# Patient Record
Sex: Male | Born: 1935 | Race: White | Hispanic: No | Marital: Single | Smoking: Former smoker
Health system: Southern US, Community
[De-identification: ages and names within clinical notes are randomized; demographics above are authoritative.]

## PROBLEM LIST (undated history)

## (undated) DIAGNOSIS — I10 Essential (primary) hypertension: Secondary | ICD-10-CM

## (undated) DIAGNOSIS — C61 Malignant neoplasm of prostate: Secondary | ICD-10-CM

## (undated) DIAGNOSIS — Z950 Presence of cardiac pacemaker: Secondary | ICD-10-CM

## (undated) DIAGNOSIS — I2510 Atherosclerotic heart disease of native coronary artery without angina pectoris: Secondary | ICD-10-CM

## (undated) DIAGNOSIS — I442 Atrioventricular block, complete: Secondary | ICD-10-CM

## (undated) HISTORY — DX: Presence of cardiac pacemaker: Z95.0

## (undated) HISTORY — PX: OTHER SURGICAL HISTORY: SHX169

## (undated) HISTORY — PX: UMBILICAL HERNIA REPAIR: SHX196

## (undated) HISTORY — DX: Malignant neoplasm of prostate: C61

## (undated) HISTORY — PX: CORONARY ARTERY BYPASS GRAFT: SHX141

## (undated) HISTORY — DX: Essential (primary) hypertension: I10

## (undated) HISTORY — DX: Atrioventricular block, complete: I44.2

## (undated) HISTORY — PX: PERCUTANEOUS PINNING TIBIA FRACTURE: SUR1020

## (undated) HISTORY — DX: Atherosclerotic heart disease of native coronary artery without angina pectoris: I25.10

## (undated) HISTORY — PX: CARDIAC ASSIST DEVICE INSERTION: SHX1288

---

## 2002-03-28 ENCOUNTER — Encounter: Admission: RE | Admit: 2002-03-28 | Discharge: 2002-06-26 | Payer: Self-pay | Admitting: Neurological Surgery

## 2002-08-26 ENCOUNTER — Encounter: Admission: RE | Admit: 2002-08-26 | Discharge: 2002-08-26 | Payer: Self-pay | Admitting: Urology

## 2002-08-26 ENCOUNTER — Encounter: Payer: Self-pay | Admitting: Urology

## 2004-01-11 IMAGING — CT CT ABDOMEN W/ CM
1 series · 15 of 32 positions shown, 19 images · IV contrast (GASTRO. & OMNIAPQUE [ID])
Comparison: none

FINDINGS
CLINICAL DATA: PROSTATE CANCER.   CON:
CT OF THE ABDOMEN WITH CONTRAST
MULTIDETECTOR HELICAL SCANS THROUGH THE ABDOMEN WERE PERFORMED AFTER ORAL AND IV CONTRAST MEDIA
WERE GIVEN.  150 CC OF OMNIPAQUE 300 WERE GIVEN AS A CONTRAST MEDIA.
THE LUNG BASES SHOW MILD BULLOUS CHANGE.  NO ACTIVE PROCESS IS SEEN.  THE LIVER IS WELL SEEN WITH
NO FOCAL ABNORMALITY.  THERE IS A ROUNDED AREA OF FAT WITHIN THE RIGHT ADRENAL GLAND WHICH APPEARS
BENIGN AND MAY REPRESENT A LIPOMA.  THE LEFT ADRENAL GLAND IS NORMAL.  THE PANCREAS IS NORMAL IN
SIZE.  SCANNING THROUGH THE KIDNEYS NO HYDRONEPHROSIS IS SEEN AND NO RENAL CALCULI ARE NOTED.
THERE IS A PROBABLE SMALL CYST WITHIN THE PARENCHYMA OF THE LEFT POSTERIOR KIDNEY BUT TOO SMALL TO
CHARACTERIZE.  NO ADENOPATHY IS SEEN.  THE ABDOMINAL AORTA IS NORMAL IN CALIBER.

[Series 2: — · axial · 0.86mm/px · z∈[-364,-19]mm · 15 of 108 slices shown, 19 images]
[im 7/108  soft-tissue]
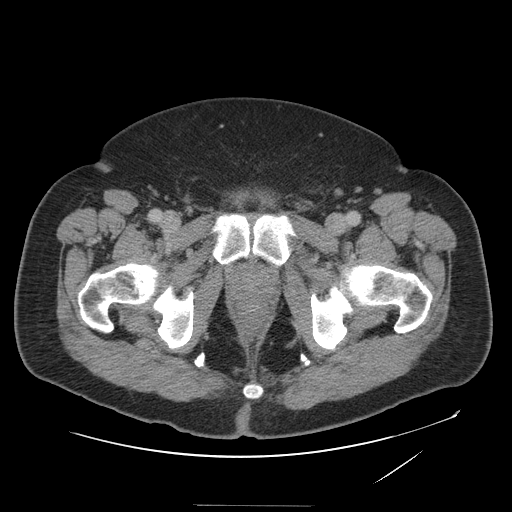
[im 7/108  bone]
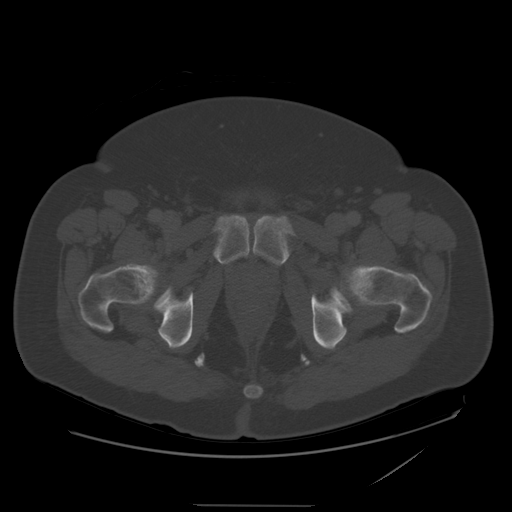
[im 14/108  soft-tissue]
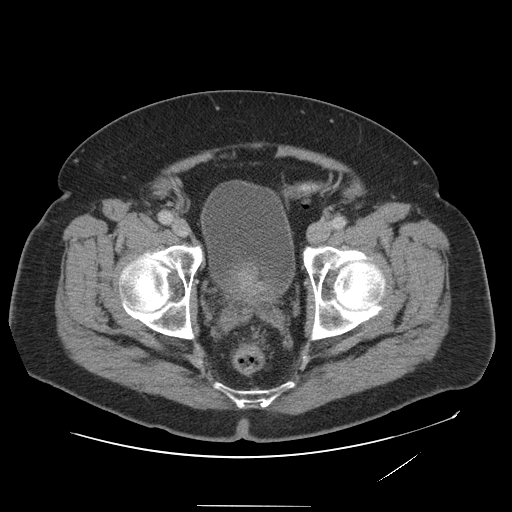
[im 21/108  soft-tissue]
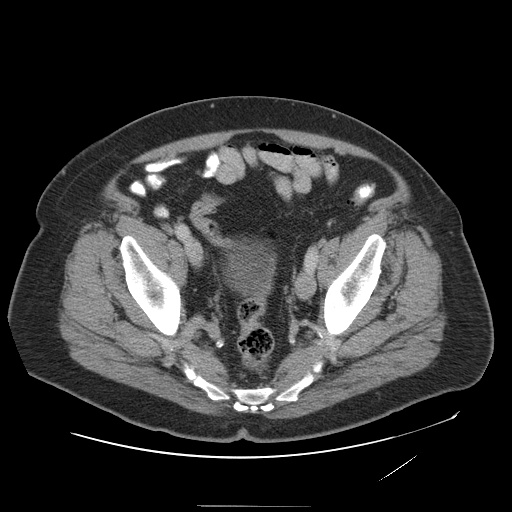
[im 32/108  soft-tissue]
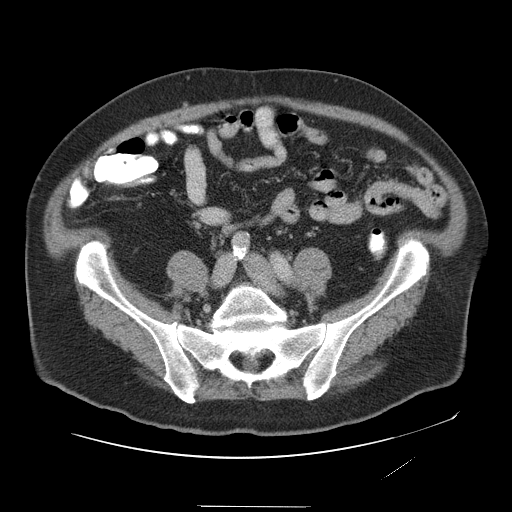
[im 38/108  soft-tissue]
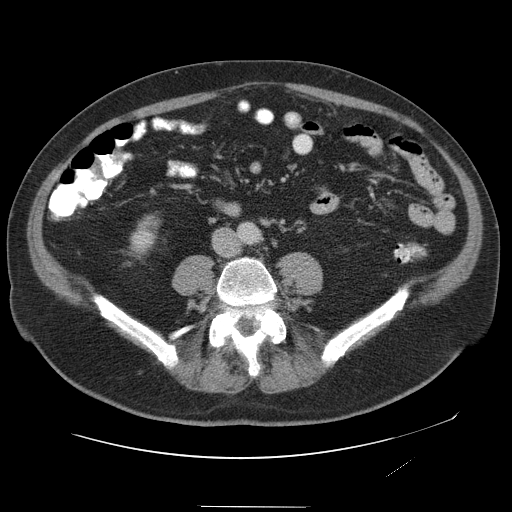
[im 45/108  soft-tissue]
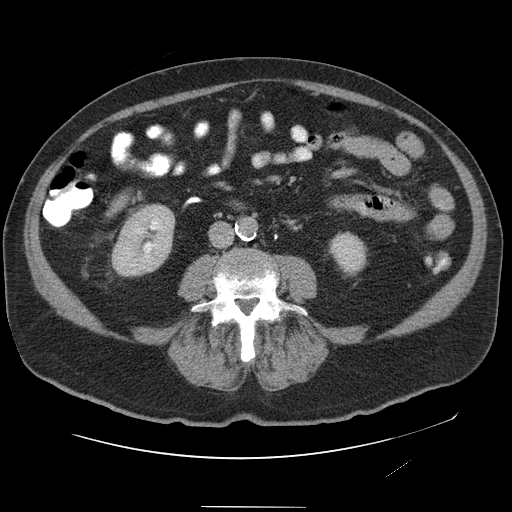
[im 56/108  soft-tissue]
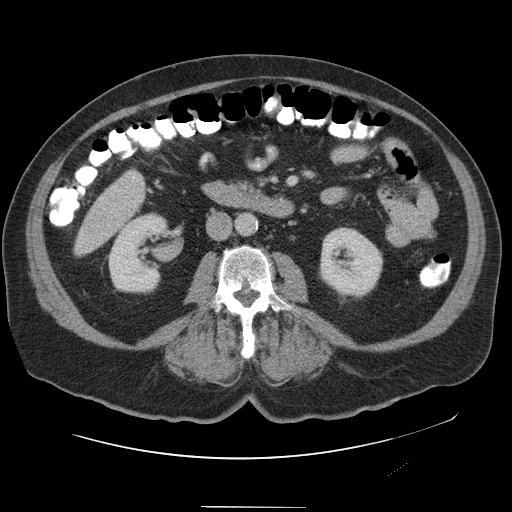
[im 63/108  soft-tissue]
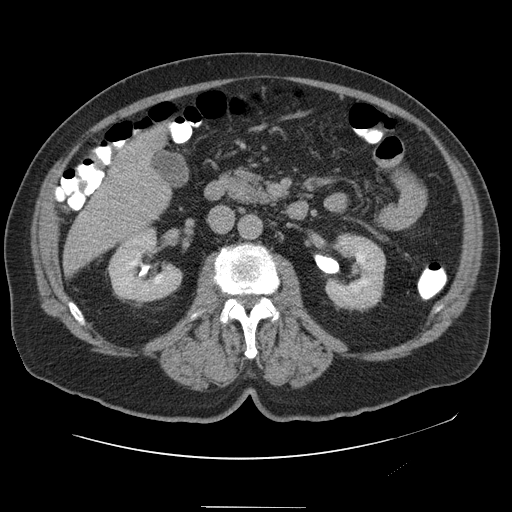
[im 70/108  soft-tissue]
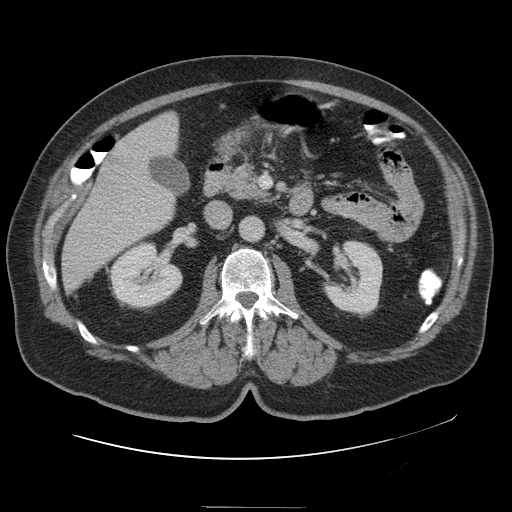
[im 70/108  bone]
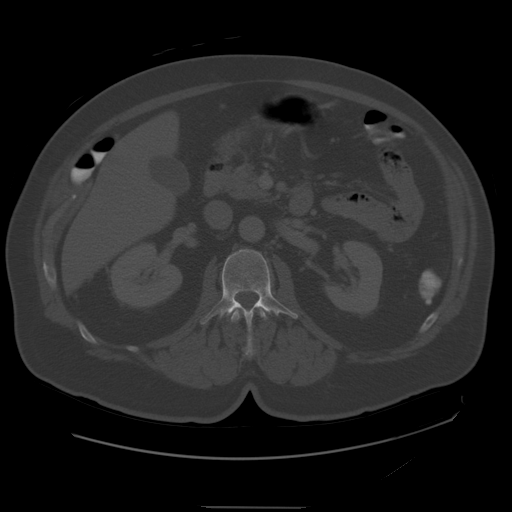
[im 76/108  soft-tissue]
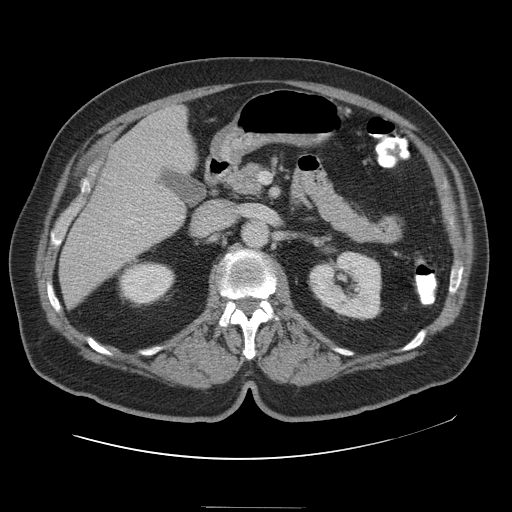
[im 87/108  soft-tissue]
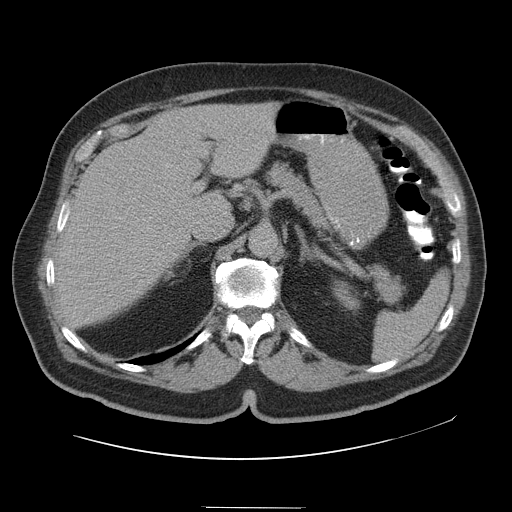
[im 94/108  soft-tissue]
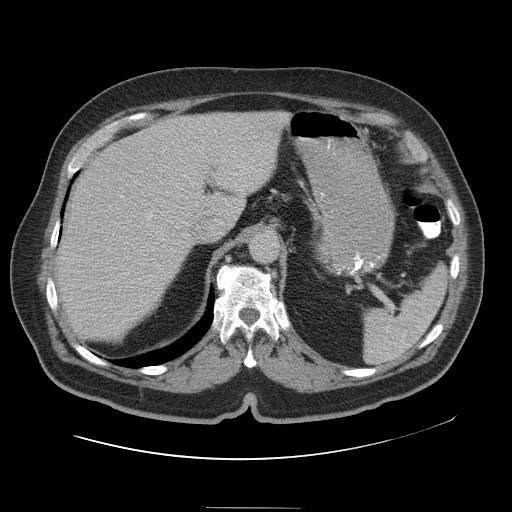
[im 94/108  lung]
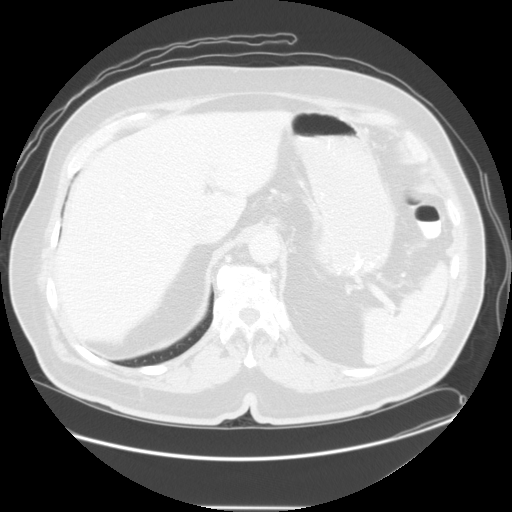
[im 97/108  lung]
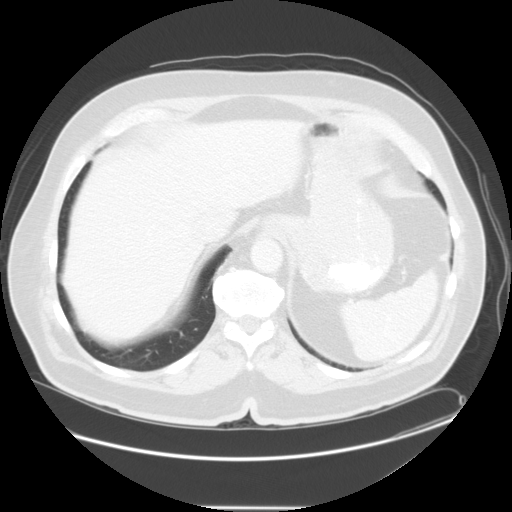
[im 101/108  soft-tissue]
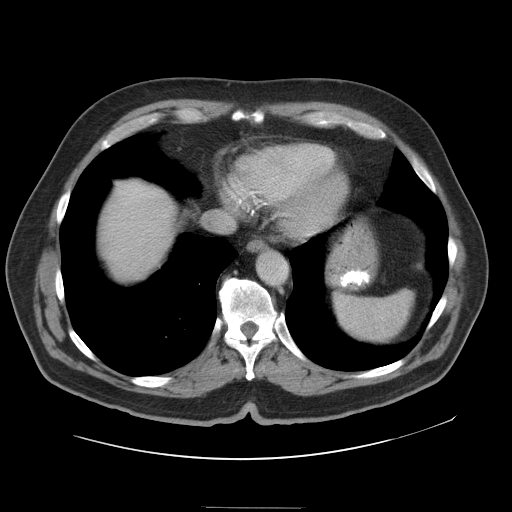
[im 101/108  lung]
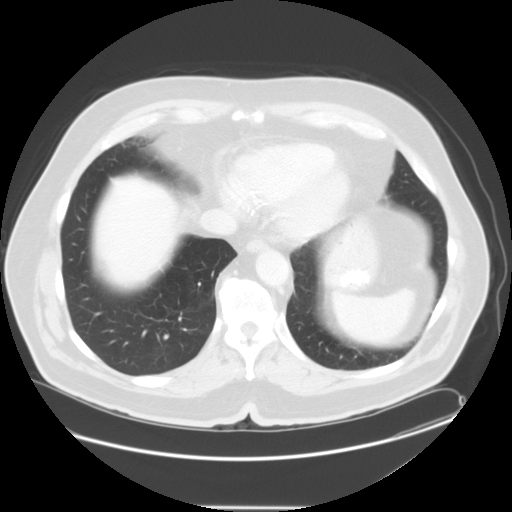
[im 104/108  lung]
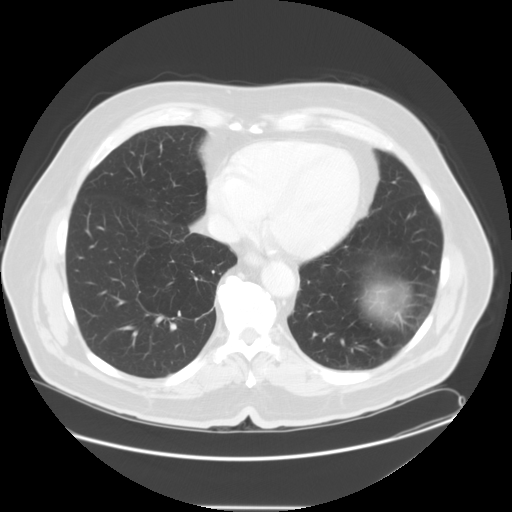

[15 of 32 positions shown; findings below may reference images not displayed]

IMPRESSION: NEGATIVE CT OF THE ABDOMEN FOR METASTATIC INVOLVEMENT.
CT OF THE PELVIS
SCANS WERE CONTINUED THROUGH THE PELVIS AFTER ORAL AND IV CONTRAST MEDIA WERE GIVEN.  THE URINARY
BLADDER IS UNREMARKABLE.  THE PROSTATE IS MODERATELY ENLARGED INDENTING THE POSTERIOR INFERIOR
ASPECT OF THE URINARY BLADDER.  THE DISTAL URETERS ARE NORMAL IN CALIBER.  THERE IS SOME THICKENING
OF THE MUCOSA OF THE RECTOSIGMOID COLON WITH A FEW DIVERTICULA BUT NO DIVERTICULITIS IS SEEN.  THE
APPENDIX IS GROSSLY NORMAL.
IMPRESSION
MODERATELY ENLARGED PROSTATE.  NO EVIDENCE OF LOCAL SPREAD OF TUMOR AND NO PELVIC ADENOPATHY.

## 2004-02-18 ENCOUNTER — Inpatient Hospital Stay (HOSPITAL_COMMUNITY): Admission: EM | Admit: 2004-02-18 | Discharge: 2004-02-23 | Payer: Self-pay | Admitting: Emergency Medicine

## 2004-02-19 ENCOUNTER — Encounter (INDEPENDENT_AMBULATORY_CARE_PROVIDER_SITE_OTHER): Payer: Self-pay | Admitting: *Deleted

## 2004-04-25 ENCOUNTER — Encounter: Admission: RE | Admit: 2004-04-25 | Discharge: 2004-07-24 | Payer: Self-pay | Admitting: Cardiology

## 2004-07-25 ENCOUNTER — Encounter (HOSPITAL_COMMUNITY): Admission: RE | Admit: 2004-07-25 | Discharge: 2004-10-23 | Payer: Self-pay | Admitting: Cardiology

## 2005-07-05 ENCOUNTER — Encounter: Admission: RE | Admit: 2005-07-05 | Discharge: 2005-08-07 | Payer: Self-pay | Admitting: Internal Medicine

## 2006-02-07 ENCOUNTER — Encounter: Admission: RE | Admit: 2006-02-07 | Discharge: 2006-02-07 | Payer: Self-pay | Admitting: Internal Medicine

## 2009-09-10 ENCOUNTER — Encounter: Payer: Self-pay | Admitting: Internal Medicine

## 2010-05-24 ENCOUNTER — Ambulatory Visit: Payer: Self-pay | Admitting: Internal Medicine

## 2010-05-24 DIAGNOSIS — Z950 Presence of cardiac pacemaker: Secondary | ICD-10-CM | POA: Insufficient documentation

## 2010-05-24 DIAGNOSIS — I442 Atrioventricular block, complete: Secondary | ICD-10-CM | POA: Insufficient documentation

## 2010-05-24 DIAGNOSIS — E669 Obesity, unspecified: Secondary | ICD-10-CM

## 2010-05-26 ENCOUNTER — Encounter: Payer: Self-pay | Admitting: Internal Medicine

## 2010-05-31 ENCOUNTER — Telehealth (INDEPENDENT_AMBULATORY_CARE_PROVIDER_SITE_OTHER): Payer: Self-pay | Admitting: *Deleted

## 2010-06-27 ENCOUNTER — Telehealth: Payer: Self-pay | Admitting: Internal Medicine

## 2010-07-15 ENCOUNTER — Encounter: Admission: RE | Admit: 2010-07-15 | Discharge: 2010-07-15 | Payer: Self-pay | Admitting: Internal Medicine

## 2010-08-25 ENCOUNTER — Ambulatory Visit: Payer: Self-pay | Admitting: Internal Medicine

## 2010-09-13 ENCOUNTER — Encounter: Payer: Self-pay | Admitting: Internal Medicine

## 2010-11-01 ENCOUNTER — Encounter: Payer: Self-pay | Admitting: Internal Medicine

## 2010-11-01 ENCOUNTER — Ambulatory Visit: Payer: Self-pay | Admitting: Internal Medicine

## 2010-11-01 DIAGNOSIS — I5032 Chronic diastolic (congestive) heart failure: Secondary | ICD-10-CM | POA: Insufficient documentation

## 2010-11-27 ENCOUNTER — Encounter: Payer: Self-pay | Admitting: Cardiology

## 2010-12-06 NOTE — Cardiovascular Report (Signed)
Summary: Office Visit   Office Visit   Imported By: Roderic Ovens 05/24/2010 15:36:00  _____________________________________________________________________  External Attachment:    Type:   Image     Comment:   External Document

## 2010-12-06 NOTE — Letter (Signed)
Summary: [facility]   Imported By: Marylou Mccoy 06/03/2010 15:56:25  _____________________________________________________________________  External Attachment:    Type:   Image     Comment:   External Document

## 2010-12-06 NOTE — Miscellaneous (Signed)
Summary: [facility]iology Device [facility]   Allergies: No Known Drug Allergies  PPM Specifications Following MD:  Sherryl Manges, MD     PPM Vendor:  Medtronic     PPM Model Number:  [id number]     PPM Serial Number:  [id number] PPM DOI:  02/22/2004     PPM Implanting MD:  Sherryl Manges, MD  Lead 1    Location: RA     DOI: 02/22/2004     Model #: [id number]     Serial #: [id number]     Status: active Lead 2    Location: RV     DOI: 02/22/2004     Model #: [id number]     Serial #: [id number]     Status: active  Parameters Mode:  DDDR     Lower Rate Limit:  60     Upper Rate Limit:  130 Paced AV Delay:  150     Sensed AV Delay:  120

## 2010-12-06 NOTE — Letter (Signed)
Summary: Remote Device Check  [facility], Main Office  [address]   [place], [place] [address]   Phone: [phone]  Fax: [phone]     September 13, 2010 MRN: [id number]   Richard Charles [address] [place], [place]  [address]   Dear Mr. STRAUCH,   Your remote transmission was recieved and reviewed by your physician.  All diagnostics were within normal limits for you.   __X____Your next office visit is scheduled for:  11-01-2010 @ 915 with Dr Graciela Husbands.   Sincerely,  Vella Kohler

## 2010-12-06 NOTE — Progress Notes (Signed)
Summary: question re new device  Phone Note Call from Patient   Caller: Patient Reason for Call: Talk to Nurse Summary of Call: questions re pacemaker-just recieved yesterday-no magnet on this one, wants to know if it's supposed to or not?-pls cll [phone] Initial call taken by: Glynda Jaeger,  May 31, 2010 9:55 AM  Follow-up for Phone Call        Reviewed procedure for remote transmissions, patient seemed to understand and will send on 08/25/10 Follow-up by: Altha Harm, LPN,  May 31, 2010 5:04 PM

## 2010-12-06 NOTE — Progress Notes (Signed)
Summary: number to medtronic  Phone Note Call from Patient   Caller: Patient Reason for Call: Talk to Nurse Summary of Call: pt needs number for medtronic-says the number on transmitter says to call if needs to be returned-what number does he [phone] Initial call taken by: Glynda Jaeger,  June 27, 2010 10:50 AM  Additional Follow-up for Phone Call Additional follow up Details #1::        SPOKE W/PT AND EXPLAINED FOR CARELINK PT DOESN'T NEED TO TALK WITH ANYONE WHEN DUE FOR CARELINK TRANSMISSION.  PT VOICES UNDERSTANDING. Vella Kohler  June 27, 2010 12:19 PM

## 2010-12-06 NOTE — Assessment & Plan Note (Signed)
Summary: Richard Charles establish/per kristin   History of Present Illness: Richard Charles is seen at his own request to establish long-term care.  He has a history of coronary artery disease and underwent bypass grafting in [facility] 6 years ago. He presented at that time with congestive heart failure shock and a probable MI. A year later after he moved back to [facility]  he developed bradycardia and underwent pacemaker implantation (by me) at the request of Dr.Turner. He is hopefully become pacemaker dependent.  he has past medical history notable for obesity and his recently been able to lose 30 pounds. This is an associate of the marked improvement in his blood pressure and his exercise tolerance.  His occasional palpitations. He does not carry a diagnosis of atrial fibrillation.    Current Medications (verified): 1)  Ramipril 5 Mg Caps (Ramipril) .... Take One Capsule Once Daily 2)  Carvedilol 6.25 Mg Tabs (Carvedilol) .... Take One Tablet Two Times A Day 3)  Lipitor 20 Mg Tabs (Atorvastatin Calcium) .... Take One Tablet Once Daily 4)  Aspirin 325 Mg Tabs (Aspirin) .... Once Daily 5)  Fish Oil 1200 Mg Caps (Omega-3 Fatty Acids) .... Take One Capsule Two Times A Day 6)  Viagra 100 Mg Tabs (Sildenafil Citrate) .... Take One Tablet As Directed 7)  Stool Softener 240 Mg Caps (Docusate Calcium) .... Use As Directed  Allergies (verified): No Known Drug Allergies  Past History:  Past Surgical History: Last updated: 05/23/2010 Status post CABG Right tibia pinning Incarcerated umbilical hernia repair Removal of skin cyst on his right hand  Family History: Last updated: 05/24/2010  Noncontributory Family History of Coronary Artery Disease:   Social History: Last updated: 05/23/2010 He lives with his girlfriend Positive social alcohol use No current tobacco use for the past almost 20 years.  He smoked for about 30or more years He is retired from [employer]  Past Medical  History: Hypertension Coronary artery disease Status post CABG x5 vessels Prostate cancer Diet-controlled diabetes mellitus, type 2 low-grade emphysema  Family History:  Noncontributory Family History of Coronary Artery Disease:   Review of Systems       full review of systems was negative apart from a history of present illness and past medical history.   Vital Signs:  Patient profile:   77 year old male Height:      69 inches Weight:      213 pounds BMI:     31.57 Pulse rate:   64 / minute BP sitting:   144 / 78  (right arm) Cuff size:   regular  Vitals Entered By: Judithe Modest CMA (May 24, 2010 11:53 AM)  Physical Exam  General:  Well developed, well nourishedelderly Caucasian male appearing his stated age, in no acute distress. Head:  normal HEENT Neck:  supple with flat neck vein Chest Wall:  without CVA tenderness Lungs:  clear to auscultation Heart:  regular rate and rhythm without murmurs; S4 present Abdomen:  obese soft with active bowel sounds without hepatomegaly midline pulsation not appreciate Msk:  Back normal, normal gait  Pulses:  intact distal to Neurologic:  alert and oriented ; grossly normal motor and sensory function Skin:  warm and dry Cervical Nodes:  no adenopathy Psych:  a GI   EKG  Procedure date:  05/24/2010  Findings:      AV paced  PPM Specifications Following MD:  Sherryl Manges, MD     PPM Vendor:  Medtronic     PPM Model Number:  [id number]     PPM Serial Number:  [id number] PPM DOI:  02/22/2004     PPM Implanting MD:  Sherryl Manges, MD  Lead 1    Location: RA     DOI: 02/22/2004     Status: active Lead 2    Location: RV     DOI: 02/22/2004     Status: active  PPM Follow Up Battery Voltage:  2.72 V     Battery Est. Longevity:  17YRS       PPM Device Measurements Atrium  Amplitude: 1.40 mV, Impedance: 563 ohms, Threshold: 0.50 V at 0.40 msec Right Ventricle  Amplitude: PACED mV, Impedance: 567 ohms, Threshold: 0.50 V at 0.40  msec  Episodes MS Episodes:  0     Percent Mode Switch:  0     Ventricular High Rate:  0     Atrial Pacing:  74.7%     Ventricular Pacing:  99.9%  Parameters Mode:  DDDR     Lower Rate Limit:  60     Upper Rate Limit:  130 Paced AV Delay:  150     Sensed AV Delay:  120 Next Remote Date:  08/25/2010     Next Cardiology Appt Due:  05/08/2011 Tech Comments:  NORMAL DEVICE FUNCTION.  PACER DEPENDENT--NO RWAVES AT VVI 30.  NO CHANGES MADE. AT NEXT VISIT TURN ADL DOWN FROM 4 TO 3 DUE TO HISTOGRAM.  ENROLL PT IN CARELINK.  CARELINK CHECK 08-25-10. ROV IN 6 mths  W/SK.  Vella Kohler  May 24, 2010 1:33 PM  Impression & Recommendations:  Problem # 1:  PACEMAKER, PERMANENT (ICD-V45.01) Device parameters and data were reviewed and no changes were made  Problem # 2:  CARDIOMYOPATHY, ISCHEMIC  S/P CABG EF 35-40% (ICD-414.8) the patient has ischemic heart disease with ejection fraction of about 35%. He has no significant symptoms. He is up her bili on beta blockers andACE inhibitor. His carvedilol dose will be up titrated to 2 garner incremental benefit His updated medication list for this problem includes:    Ramipril 5 Mg Caps (Ramipril) .Marland Kitchen... Take one capsule once daily    Carvedilol 12.5 Mg Tabs (Carvedilol) .Marland Kitchen... Take one tablet by mouth twice a day    Aspirin 325 Mg Tabs (Aspirin) ..... 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 Take one capsule once daily    Carvedilol 12.5 Mg Tabs (Carvedilol) .Marland Kitchen... Take one tablet by mouth twice a day    Aspirin 325 Mg Tabs (Aspirin) ..... Once daily  Patient Instructions: 1)  Your physician has recommended you make the following change in your medication: Increase Carvedilol to 12.5mg  1 tablet  twice daily. 2)  Your physician wants you to follow-up in: 6 months with Dr Graciela Husbands.   You will receive a reminder letter in the mail two months in advance. If you don't receive a letter, please call our office to schedule the follow-up appointment. Prescriptions: CARVEDILOL 12.5 MG TABS (CARVEDILOL) Take one tablet by mouth twice a day  #180 x 3   Entered by:   Optometrist BSN   Authorized by:   Nathen May, MD, [facility]   Signed by:   Optometrist  BSN on 05/24/2010   Method used:   Electronically to        [facility] (retail)       [address] [place], [place]  [address]       Ph: [phone]       Fax: [phone]   RxID:   [id number]

## 2010-12-06 NOTE — Cardiovascular Report (Signed)
Summary: Office Visit Remote   Office Visit Remote   Imported By: Roderic Ovens 09/15/2010 15:13:17  _____________________________________________________________________  External Attachment:    Type:   Image     Comment:   External Document

## 2010-12-08 NOTE — Cardiovascular Report (Signed)
Summary: Office Visit   Office Visit   Imported By: Roderic Ovens 11/02/2010 09:15:01  _____________________________________________________________________  External Attachment:    Type:   Image     Comment:   External Document

## 2010-12-08 NOTE — Assessment & Plan Note (Signed)
Summary: pacer check.mdt.amber   Visit Type:  medtronic pacemaker  CC:  no complaints.  History of Present Illness:     Richard Charles is seen in followup for pacemaker implanted for complete heart block in 2005 by Dr. Gevena Barre me apparently)  He has a history of coronary artery disease and underwent bypass grafting in [facility] [place] 6 years ago. He presented at that time with congestive heart failure shock and a probable MI. A year later after he moved back to [facility]  he developed bradycardia and underwent pacemaker implantation    he has past medical history notable for obesity and his recently been able to lose 30 pounds. This is an associate of the marked improvement in his blood pressure and his exercise tolerance.  The patient denies SOB, chest pain, edema or palpitations  At his last visit we tried to increase his carvedilol from 6-12 mg twice daily. This he did not tolerate and reduced it back to 6. About a week ago he tried again to increase it to 12. Thus far he is tolerated quite well.    Problems Prior to Update: 1)  Obesity  (ICD-278.00) 2)  Pacemaker, Permanent  (ICD-V45.01) 3)  Cardiomyopathy, Ischemic S/p Cabg Ef 35-40%  (ICD-414.8) 4)  Av Block, Complete  (ICD-426.0)  Current Medications (verified): 1)  Ramipril 5 Mg Caps (Ramipril) .... Take One Capsule Once Daily 2)  Carvedilol 12.5 Mg Tabs (Carvedilol) .... Take One Tablet By Mouth Twice A Day 3)  Lipitor 20 Mg Tabs (Atorvastatin Calcium) .... Take One Tablet Once Daily 4)  Aspirin 325 Mg Tabs (Aspirin) .... Once Daily 5)  Fish Oil 1200 Mg Caps (Omega-3 Fatty Acids) .... Take One Capsule Two Times A Day 6)  Viagra 100 Mg Tabs (Sildenafil Citrate) .... Take One Tablet As Directed 7)  Stool Softener 240 Mg Caps (Docusate Calcium) .... Use As Directed  Allergies (verified): No Known Drug Allergies  Past History:  Past Medical History: Last updated: 05/24/2010 Hypertension Coronary artery  disease Status post CABG x5 vessels Prostate cancer Diet-controlled diabetes mellitus, type 2 low-grade emphysema  Past Surgical History: Last updated: 05/23/2010 Status post CABG Right tibia pinning Incarcerated umbilical hernia repair Removal of skin cyst on his right hand  Family History: Last updated: 05/24/2010  Noncontributory Family History of Coronary Artery Disease:   Social History: Last updated: 05/23/2010 He lives with his girlfriend Positive social alcohol use No current tobacco use for the past almost 20 years.  He smoked for about 30or more years He is retired from [employer]  Vital Signs:  Patient profile:   77 year old male Height:      69 inches Weight:      215 pounds BMI:     31.86 BP sitting:   148 / 90  (right arm) Cuff size:   regular  Vitals Entered By: Caralee Ates CMA (November 01, 2010 9:57 AM)  Physical Exam  General:  The patient was alert and oriented in no acute distress. HEENT Normal.  Neck veins were flat, carotids were brisk.  Lungs were clear.  Heart sounds were regular without murmurs or gallops.  Abdomen was soft with active bowel sounds. There is no clubbing cyanosis or edema. Skin Warm and dry    EKG  Procedure date:  11/01/2010  Findings:      AV pacing  PPM Specifications Following MD:  Sherryl Manges, MD     PPM Vendor:  Medtronic     PPM Model Number:  [id number]     PPM Serial Number:  [id number] PPM DOI:  02/22/2004     PPM Implanting MD:  Sherryl Manges, MD  Lead 1    Location: RA     DOI: 02/22/2004     Model #: [id number]     Serial #: [id number]     Status: active Lead 2    Location: RV     DOI: 02/22/2004     Model #: [id number]     Serial #: [id number]     Status: active  PPM Follow Up Remote Check?  No Battery Voltage:  2.70 V     Battery Est. Longevity:  21 months     Pacer Dependent:  Yes       PPM Device Measurements Atrium  Amplitude: 0.7 mV, Impedance: 567 ohms, Threshold: 0.5 V at 0.4 msec Right Ventricle   Impedance: 548 ohms, Threshold: 0.5 V at 0.4 msec  Episodes MS Episodes:  5     Percent Mode Switch:  <0.1%     Coumadin:  No Ventricular High Rate:  1     Atrial Pacing:  74.2%     Ventricular Pacing:  100%  Parameters Mode:  DDDR     Lower Rate Limit:  60     Upper Rate Limit:  130 Paced AV Delay:  150     Sensed AV Delay:  120 Next Remote Date:  02/02/2011     Next Cardiology Appt Due:  10/07/2011 Tech Comments:  Rate response ADL response reprogrammed 4->3.  Carelink transmissions every 3 months.  ROV 1 year with Dr. Graciela Husbands. Altha Harm, LPN  November 01, 2010 9:33 AM   Impression & Recommendations:  Problem # 1:  CARDIOMYOPATHY, ISCHEMIC  S/P CABG EF 35-40% (ICD-414.8) without significant change in symptoms; he has tried uptitrated carvedilol again which he is tolerating. We will further up titrated at his next visit. Other medications will be maintained. At his next visit we will also add Aldactone His updated medication list for this problem includes:    Ramipril 5 Mg Caps (Ramipril) .Marland Kitchen... Take one capsule once daily    Carvedilol 12.5 Mg Tabs (Carvedilol) .Marland Kitchen... Take one tablet by mouth twice a day    Aspirin 325 Mg Tabs (Aspirin) ..... Once daily  Problem # 2:  AV BLOCK, COMPLETE (ICD-426.0) status post pacer His updated medication list for this problem includes:    Ramipril 5 Mg Caps (Ramipril) .Marland Kitchen... Take one capsule once daily    Carvedilol 12.5 Mg Tabs (Carvedilol) .Marland Kitchen... Take one tablet by mouth twice a day    Aspirin 325 Mg Tabs (Aspirin) ..... Once daily  Problem # 3:  PACEMAKER, PERMANENT (ICD-V45.01) Device parameters and data were reviewed and no changes were made  Problem # 4:  SYSTOLIC HEART FAILURE, CHRONIC (ICD-428.22) stable on crrent  meds His updated medication list for this problem includes:    Ramipril 5 Mg Caps (Ramipril) .Marland Kitchen... Take one capsule once daily    Carvedilol 12.5 Mg Tabs (Carvedilol) .Marland Kitchen... Take one tablet by mouth twice a day    Aspirin 325 Mg  Tabs (Aspirin) ..... Once daily  Patient Instructions: 1)  Your physician recommends that you continue on your current medications as directed. Please refer to the Current Medication list given to you today. 2)  Your physician wants you to follow-up in: 6 months with Dr. Graciela Husbands.  You will receive a reminder letter in the mail two months in advance. If you don't receive a letter,  please call our office to schedule the follow-up appointment.

## 2011-01-16 ENCOUNTER — Telehealth: Payer: Self-pay | Admitting: Internal Medicine

## 2011-01-24 NOTE — Progress Notes (Signed)
Summary: question on meds  Phone Note Call from Patient Call back at Home Phone [phone]   Caller: Patient Reason for Call: Talk to Nurse Summary of Call: pt has question re meds Initial call taken by: Roe Coombs,  January 16, 2011 9:35 AM  Follow-up for Phone Call        Spoke with pt he is going on a [place] to [place] on 02/08/11 and [place].  Some of the time it may be rough waters.  He is wondering what he can take with his heart condition.  Would Dramamine be okay for him to take if needed or can he take to prevent the sickness.  Please let patient know Dennis Bast, RN, BSN  January 16, 2011 10:25 AM  Additional Follow-up for Phone Call Additional follow up Details #1::        PER DR Graciela Husbands PT MAY TAKE DRAMAMINE.  PT AWARE OF ABOVE  Additional Follow-up by: Scherrie Bateman, LPN,  January 17, 2011 5:57 PM

## 2011-02-02 ENCOUNTER — Ambulatory Visit (INDEPENDENT_AMBULATORY_CARE_PROVIDER_SITE_OTHER): Payer: Medicare Other | Admitting: *Deleted

## 2011-02-02 DIAGNOSIS — I442 Atrioventricular block, complete: Secondary | ICD-10-CM

## 2011-02-02 NOTE — Progress Notes (Signed)
Remote pacer check  

## 2011-03-07 ENCOUNTER — Encounter: Payer: Self-pay | Admitting: *Deleted

## 2011-03-24 NOTE — Consult Note (Signed)
NAME:  Richard Charles, Richard Charles.                          ACCOUNT NO.:  [id number]   MEDICAL RECORD NO.:  [id number]                   PATIENT TYPE:  INP   LOCATION:  3731                                 FACILITY:  [facility]   PHYSICIAN:  Armanda Magic, M.D.                  DATE OF BIRTH:  28-Mar-1931   DATE OF CONSULTATION:  02/19/2004  DATE OF DISCHARGE:                                   CONSULTATION   REFERRING PHYSICIAN:  Georgann Housekeeper, M.D.   CHIEF COMPLAINT:  Bradycardia and ventricular tachycardia.   This is a 77 year old male admitted yesterday with bradycardia.  He began  having complaints of dizziness and was found by his primary care physician  to have a heart rate in the 30s.  His beta-blocker was held.  His last dose  was on February 15, 2004.  He still has persistent bradycardia with heart rates  in the 30s to 40s and exertional symptoms with dizziness.   PAST MEDICAL HISTORY:  1. Significant for coronary disease status post coronary artery bypass     grafting on January 07, 2004.  He had actually presented with syncope and     then was found to have evidence of an old MI, underwent cath and     underwent coronary artery bypass grafting.  2. Hypertension.  3. Prostate CA.  4. Non-insulin-dependent diabetes mellitus diet controlled.  5. Hyperlipidemia.   OUTPATIENT MEDICATIONS:  1. Aspirin 325 mg a day.  2. Lipitor 10 mg.  3. Lopressor but that was discontinued.  His last dose was on the 11th.   ALLERGIES:  None.   FAMILY HISTORY:  Noncontributory.   SOCIAL HISTORY:  He denies tobacco or alcohol use.  He lives with his  girlfriend.  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   REVIEW OF SYSTEMS:  Other than what is stated in the HPI is negative.   PHYSICAL EXAMINATION:  GENERAL:  A well-developed, well-nourished male in no  acute distress.  VITAL SIGNS:  Blood pressure 138/68, heart rate 38, respirations 18.  He is  afebrile.  O2 saturation is 97% on 2 liters.  HEENT:   Benign.  NECK:  Supple without lymphadenopathy, carotid upstrokes +2 bilaterally with  no bruits.  LUNGS:  Clear to auscultation throughout.  HEART:  Regular rate and rhythm, bradycardic with a 2/6 systolic murmur at  the right upper sternal border radiating to the apex.  Normal S1 & S2.  ABDOMEN:  Soft, nontender, nondistended, with active bowel sounds.  No  hepatosplenomegaly.  EXTREMITIES:  No edema.   LABORATORY DATA:  CPK 90 and 72, MB is 3 and 2.7, troponin 0.03 and 0.02.  Sodium 143, potassium 5.1, chloride 110, bicarb 30, BUN 12, creatinine 0.9,  glucose 109.  White cell count 9, hemoglobin 12.8, hematocrit 38.8, platelet  count 294.  SGOT 51, SGPT 55, magnesium 2, phosphorus 3.9.  EKG shows  second-  degree AV block with left bundle branch block aberration.  Telemetry strips  reveal several runs of nonsustained ventricular tachycardia.   ASSESSMENT:  1. Second-degree atrioventricular block, beta-blocker discontinued.  His     last dose was 48 hours ago.  2. Nonsustained ventricular tachycardia questionable scar ventricular     tachycardia.  He was noted to have an old myocardial infarction by cath.     Unknown left ventricular function at this time.  3. Coronary artery disease status post five-vessel coronary artery bypass     grafting on January 07, 2004.  4. Diet-controlled non-insulin-dependent diabetes mellitus.  5. Hypertension.   PLAN:  Check a 2-D echocardiogram.  If LV function within normal limits,  needs EP study to evaluate for inducible ventricular tachycardia.  He may  need an AICD.  If LV function is decreasing, he needs a stress Cardiolite  study to evaluate for ischemia to rule out early graft failure and then AICD  implantation.  Will rule out MI with serial enzymes, negative so far, and  continue aspirin.                                               Armanda Magic, M.D.    TT/MEDQ  D:  02/19/2004  T:  02/20/2004  Job:  [id number]   cc:   Georgann Housekeeper,  M.D.  [address]  [place]  [place] [address]  Fax: [phone]

## 2011-03-24 NOTE — Consult Note (Signed)
NAME:  Richard Charles, Richard Charles.                          ACCOUNT NO.:  [id number]   MEDICAL RECORD NO.:  [id number]                   PATIENT TYPE:  INP   LOCATION:  3731                                 FACILITY:  [facility]   PHYSICIAN:  Duke Salvia, M.D.               DATE OF BIRTH:  13-Dec-1930   DATE OF CONSULTATION:  02/19/2004  DATE OF DISCHARGE:                                   CONSULTATION   REFERRING PHYSICIAN:  Dr. Georgann Housekeeper.   COMMENT:  Thank you very much for asking me to see Mr. Gerik Coberly in  consultation for nonsustained ventricular tachycardia in the setting of  ischemic heart disease with a prior history of syncope.   HISTORY OF PRESENT ILLNESS:  Mr. Vanblarcom is a 77 year old retired gentleman  who was found in March to have significant coronary artery disease when he  presented with syncope and exercise intolerance and was found to have major  bradycardia.  He had had a history of hypertension and diabetes, for which  he was taking an ARB; during his postoperative phase, his ARB was  discontinued and he was put on a beta blocker and subsequently discharged.  Catheterization prior to his CABG demonstrated modest depression of LV  function with an ejection fraction of about 55% and what appears by note to  be an inferobasilar wall motion abnormality.   His symptoms prior to his bypass had included progressive exercise  intolerance as well as an episode of syncope that occurred at the time while  he was working on the computer.  He stood up and went out.  He has had no  other syncope.  He has had some exercise-associated lightheadedness.   This week, after having been at a wedding on Saturday, he began to notice  progressive exercise intolerance Monday and Tuesday.  He has a blood  pressure machine at home and noted that his heart rate was in the 40s and  then down into the 30s.  He saw Dr. Donette Larry, who discontinued his beta  blocker.  The bradycardia continued to persist and  he was subsequently  admitted.   Telemetry monitoring has demonstrated nonsustained ventricular tachycardia  of 18 beats with a duration of about 7 seconds.  The patient was unaware of  this and apart from the previously mentioned episodes, there has been no  syncope.   PAST MEDICAL HISTORY:  The patient's past medical history is notable for  diabetes and hypertension as well as prostate cancer, currently being  watched.   PAST SURGICAL HISTORY:  His past surgical history is notable for his:  1. CABG.  2. Right tibial pinning.  3. Incarcerated umbilical hernia repair.  4. Skin cyst on his right arm.   SOCIAL HISTORY:  He is retired from [employer].  He lives with his  girlfriend and has for some years.  He does not smoke but uses alcohol.  FAMILY HISTORY:  Family history is noncontributory.   REVIEW OF SYSTEMS:  Review of systems is as noted in the intake notes and is  not further recounted here.   MEDICATIONS:  His medications at present include:  1. Lipitor 10 mg.  2. Sliding-scale insulin.   ALLERGIES:  He has no known drug allergies.   PHYSICAL EXAMINATION:  GENERAL:  On examination, he is an elderly Caucasian  male in no acute distress.  VITAL SIGNS:  His blood pressure was 138/68.  His pulse was 38.  His  respirations were 18 and unlabored.  His weight is 225 pounds.  HEENT:  His HEENT exam demonstrated no icterus nor xanthomata.  NECK:  The neck veins were flat.  The carotids were brisk and full  bilaterally without bruits.  BACK:  The back was without kyphosis or scoliosis.  LUNGS:  Lungs were clear.  HEART:  Heart sounds were regular with a 2/6 early systolic murmur.  ABDOMEN:  The abdomen was soft with active bowel sounds without midline  pulsation.  EXTREMITIES:  Femoral pulses were not examined.  Distal pulses were trace.  There is 1+ edema on the right, no significant edema on the left.  There are  some dependent violaceous changes bilaterally.   NEUROLOGIC:  Exam was grossly normal.  SKIN:  His skin was warm and dry.   LABORATORY AND ACCESSORY CLINICAL DATA:  Electrocardiogram dated yesterday  at 8:56 demonstrated sinus rhythm at about 90 beats per minute with high-  grade block with 2 consecutively dropped beats on occasion with a right  bundle branch block escape beat.  His other electrocardiogram demonstrated  sinus rhythm at 75 with 2:1 heart block with left bundle branch block.   Telemetry demonstrated nonsustained ventricular tachycardia of 7 seconds'  duration with 21 beats with a cycle length at about 400 msec.   IMPRESSION:  1. Nonsustained ventricular tachycardia.  2. High-grade second-degree atrioventricular block.  3. Antecedent left bundle branch block.  4. Prior syncope.  5. Coronary artery disease.     a. Status post coronary artery bypass graft in March 2005 x5.     b. Ejection fraction of 50% to 55%.     c. Prior myocardial infarction.     d. Echocardiogram showing pretty good left ventricular function, given        the poor windows, but some left ventricular enlargement to my        untrained eye.  6. Diabetes.  7. Hypertension.   Mr. Nauert has high-grade second-degree heart block.  With his nonsustained  ventricular tachycardia and his prior syncope, I think risk stratification  with electrophysiological testing is appropriate, although I think it will  probably turn out to be negative.  I will review this with Dr. Armanda Magic  and we will plan to proceed after that discussion.                                               Duke Salvia, M.D.    SCK/MEDQ  D:  02/19/2004  T:  02/20/2004  Job:  [id number]   cc:   Georgann Housekeeper, M.D.  [address]  [place]  [place] [address]  Fax: [phone]   Armanda Magic, M.D.  [address]  [place], [place] [address]  Fax: [phone]  [facility]

## 2011-03-24 NOTE — Op Note (Signed)
NAME:  Richard Charles, Richard Charles.                          ACCOUNT NO.:  [id number]   MEDICAL RECORD NO.:  [id number]                   PATIENT TYPE:  INP   LOCATION:  3731                                 FACILITY:  [facility]   PHYSICIAN:  Armanda Magic, M.D.                  DATE OF BIRTH:  1931/02/01   DATE OF PROCEDURE:  02/22/2004  DATE OF DISCHARGE:                                 OPERATIVE REPORT   REFERRING PHYSICIAN:  Georgann Housekeeper, M.D.   PROCEDURE:  Placement of a dual-chamber permanent pacemaker via the left  extrathoracic subclavian vein.   OPERATOR:  Armanda Magic, M.D.   INDICATIONS:  Second degree type 2 AV block.   COMPLICATIONS:  Third degree AV block requiring temporary pacemaker via the  right femoral vein during attempts at placement of the permanent pacemaker  ventricular lead.   IV medications include Versed 5 mg IV and fentanyl 75 mcg IV.   This is a very pleasant 77 year old white male who was recently diagnosed  with severe three-vessel coronary disease and underwent coronary artery  bypass grafting x5 vessels approximately six weeks ago.  He then developed  bradycardia.  His Lopressor was stopped and then he was subsequently  admitted with second degree type 2 heart block.  He also was noted to have  ventricular tachycardia and underwent electrophysiology study, which showed  no inducible VT.  His EF is mildly reduced at 40-45%.  He now presents for  permanent pacemaker placement.   The patient was brought to the cardiac catheterization laboratory in a  fasting, nonsedated state.  Informed consent was obtained.  The patient was  connected to continuous heart rate and pulse oximetry monitoring and  intermittent blood pressure monitoring.  The left anterior chest was prepped  and draped in a sterile fashion.  Xylocaine 1% was used for local  anesthesia.  A 4-5 cm incision was then made over the left deltopectoral  groove.  A combination of blunt dissection and  electrocautery was used to  reach the fascial plane.  Under ultrasound guidance the left extrathoracic  subclavian vein was located and a needle was placed into the left  extrathoracic subclavian vein.  It was somewhat difficult passage of the  guidewire, which wanted to go cephalic; therefore, this guidewire was  removed and a slick wire was used to gain access to the left subclavian  extrathoracic vein and was passed into the right atrium under fluoroscopic  guidance.  A 9 French sheath was then placed over the guidewire under  fluoroscopic guidance in the left extrathoracic subclavian vein and advanced  into the main subclavian vein.  The dilator sheath and the wire were removed  and the ventricular lead was placed through the sheath and the guidewire was  also placed back through the sheath.  The sheath was then peeled away,  retaining the guidewire and the lead.  The straight  stylet was removed and a  curved stylet was placed into the ventricular lead, and the lead was placed  under fluoroscopic guidance into the left ventricular outflow tract.  The  stylet was then removed and a straight stylet was placed.  The lead was  pulled back out through the right ventricular outflow tract and attempts at  advancing across the tricuspid valve caused complete heart block and then  asystole.  External pacing was applied and the patient then had to have an  emergent temporary pacing wire passed through the right femoral vein by Carole Binning, M.D.  The pacemaker procedure then continued.  Again the  curved stylet was placed into the left ventricular lead and placed into the  right ventricular outflow tract under fluoroscopic guidance.  The stylet was  then removed and a straight stylet was placed in the lead, and the lead was  pulled out of the outflow tract and advanced into the right ventricular apex  without difficulty.  After adequate pacing and sensing thresholds were  obtained, the lead  was sutured in place with 0 silk.  The 10-volt pacing was  performed, which showed no diaphragmatic pacing.  Under fluoroscopic  guidance a 9 French sheath was then placed over the retained guidewire and  advanced into the subclavian vein.  Dilator and wire were removed and an  atrial lead, atrial passive-fix lead was placed under fluoroscopic guidance  into the right atrium.  The stylet was then pulled back and the J curve was  allowed to fall up into the right atrial appendage.  After adequate  fluoroscopic placement was noted, pacing and sensing thresholds were  obtained.  After adequate pacing and sensing thresholds were obtained, the  lead was sewn in place with 0 silk.  The stylets were then removed from the  pacemaker wires and under fluoroscopic guidance the guidewire was removed.  The wound was irrigated with a solution of bacitracin and Polymyxin, and  this was the pacemaker pocket that had been created in the beginning after  the  incision was made.  After adequate hemostasis was noted, the leads were  connected to the pacemaker generator and the leads and the generator were  placed in the subcutaneous pocket and the pocket was closed, the fascial  layer using 0 silk, the subcutaneous layer using 2-0 Vicryl, the  subcuticular layer using 5-0 Vicryl.  Steri-Strips were then applied to the  wound.  At the end of the procedure the patient was transferred back to his  room in stable condition.   RESULTS:  Device data:  The device is an Enpulse [id number], serial number  [id number].  Atrial lead was a Guidant model number 4480, 52 cm, serial  number [id number], and the ventricular lead is a model number 4457, 58 cm,  Guidant serial number [id number].   Pacing and sensing data:  Atrial lead impedance 480 Ohms, ventricular lead  impedance 450 Ohms.  Atrial capture threshold was intact at 0.4 volts and  0.5 msec, ventricular lead capture was intact at 0.4 volts and 0.5 msec. Current in  the A and V was 1.1 mA, P-wave amplitude 1.2 mV, R-wave amplitude  16.5 mV.  Ten-volt test was negative in either chamber.   Baseline parameters:  The pacemaker was set at output of 3.5 V in both the A  and V at 0.5 msec and basal pacemaker rate set at 50 beats per minute.   ASSESSMENT:  1. Second degree type 2 AV  block.  2. Coronary disease, status post coronary artery bypass grafting.  3. Nonsustained ventricular tachycardia with negative inducibility on     electrophysiologic study.  4. Mild left ventricular dysfunction.  5. Successful implantation of a dual-chamber permanent pacemaker via left     extrathoracic subclavian vein approach.   PLAN:  1. Start Coreg 3.125 mg b.i.d.  2. Start Altace 5 mg a day because of LV dysfunction.  3. Continue all other current medicines.  4. Probable discharge in the morning.  5. Check a PA and lateral chest x-ray in the morning.                                               Armanda Magic, M.D.    TT/MEDQ  D:  02/22/2004  T:  02/22/2004  Job:  [id number]   cc:   Georgann Housekeeper, M.D.  [address]  [place]  [place] [address]  Fax: [phone]

## 2011-03-24 NOTE — Discharge Summary (Signed)
NAME:  Richard Charles, Richard Charles.                          ACCOUNT NO.:  [id number]   MEDICAL RECORD NO.:  [id number]                   PATIENT TYPE:  INP   LOCATION:  3731                                 FACILITY:  [facility]   PHYSICIAN:  Georgann Housekeeper, M.D.                 DATE OF BIRTH:  15-Jan-1931   DATE OF ADMISSION:  02/18/2004  DATE OF DISCHARGE:  02/23/2004                                 DISCHARGE SUMMARY   DISCHARGE DIAGNOSES:  1. Bradycardia, symptomatic.  2. Coronary artery disease, status post coronary artery bypass graft.  3. Hypertension.   MEDICATIONS:  1. Lipitor 10 mg q.d.  2. Aspirin 1 q.d.  3. Altace 5 mg q.d.  4. Coreg 6.25 mg 1 tablet b.i.d.  5. Keflex 500 mg t.i.d. for five days.   FOLLOW UP:  Follow up with Dr. Armanda Magic, cardiology and a pacemaker  clinic for pacemaker instructions.   HISTORY OF PRESENT ILLNESS:  As far as his medical history, please see H&P  for details.   HOSPITAL COURSE:  Problem 1:  CARDIAC:  The patient was 77 years old at  recent CABG in [place].  He had presented with bradycardia and syncopal  episode.  Bradycardia and presyncope were symptomatic.  He initially was on  beta blocker after his CABG which was discontinued.  He has persistently  remained bradycardic in the 30s with second degree heart block.  The patient  had underlying left bundle branch block.  His blood pressure remained  stable.   The patient was ruled out for a MI.  Cardiology was consulted.  Had a  pacemaker placed in. His echocardiogram was done which showed a normal LV  function.  No evidence of failure.   The patient hemodynamically remained stable.  He did not have any more  episodes of presyncope, syncope, or chest pain.   Problem 2:  HISTORY OF HYPERTENSION:  His blood pressure has started to rise  up and he was started on Altace as well as Coreg by the cardiology.  As far  as his history borderline type 2 diabetes, blood sugar remained stable, diet  controlled. As far as his pacemaker, it was dual-chamber placed on February 22, 2004 and he was checked on February 23, 2004.  The patient was given  instructions for his postpacemaker care and will follow up with me in three  to four weeks.  Follow up with cardiology in one week.                                                Georgann Housekeeper, M.D.   Arliss Journey  D:  03/08/2004  T:  03/09/2004  Job:  [id number]

## 2011-03-24 NOTE — H&P (Signed)
NAME:  Richard Charles, Richard Charles.                          ACCOUNT NO.:  [id number]   MEDICAL RECORD NO.:  [id number]                   PATIENT TYPE:  INP   LOCATION:  1823                                 FACILITY:  [facility]   PHYSICIAN:  Lilla Shook, M.D.            DATE OF BIRTH:  1931/10/31   DATE OF ADMISSION:  02/18/2004  DATE OF DISCHARGE:                                HISTORY & PHYSICAL   CHIEF COMPLAINT:  Low heart rate.   HISTORY OF PRESENT ILLNESS:  Richard Charles is a 77 year old patient of Dr.  Georgann Housekeeper, with a past medical history of hypertension and approximately  4 weeks status post CABG x5 vessels.  He presents with bradycardia.  He had  gone to [place] with his lady friend and experienced some syncopal episodes.  An in-hospital evaluation led to a cardiac catheterization showing an old MI  per Richard Charles and he had a subsequent CABG.  Evidently, he had a very good  and speedy recovery and even was allowed to drive in a very short amount of  time.  He describes feeling well since that time, until Monday, April 75,  2005, when he noted dizziness, a low heart rate in the 30s, but a blood  pressure that was normal.  He called Dr. Donette Larry, who stopped the metoprolol.  In the past few days, he has continued to feel very tired with exertion and  even a little bit dizzy.  Questionable episode of near-syncope.  He called  tonight complaining of these same symptoms and had a heart rate of 36-42  with a normal blood pressure.  Blood pressure when he called me was 150s  over 70s.  He had an appointment in the morning with Drs. Meade Maw and  [place], but he was currently symptomatic, so he was admitted.   REVIEW OF SYSTEMS:  Review of systems remarkable for no chest pain, no  syncope, ?near-syncope, positive dizziness, positive dyspnea on exertion.   ALLERGIES:  None.   MEDICATIONS:  1. Aspirin 325 mg daily.  2. Lipitor 10 mg daily.   PAST MEDICAL HISTORY:  1. Hypertension.  2. Prostate cancer.  Evidently, watchful waiting was recommended.  He has     had no surgical intervention and his last biopsy was completely normal.  3. Diet-controlled diabetes mellitus, type 2.  Last blood sugar was 114.  4. Coronary artery disease, status post CABG.   PAST SURGICAL HISTORY:  1. Status post CABG.  2. Right tibia pinning.  3. Incarcerated umbilical hernia repair.  4. Removal of skin cyst on his right hand.   SOCIAL HISTORY:  He lives with his girlfriend.  Positive social alcohol use.  No current tobacco use for the past almost 20 years.  He smoked for about 30  or more years.  He is retired from [employer].   FAMILY HISTORY:  Noncontributory.   REVIEW OF SYSTEMS:  Review  of systems as above.   EXAM:  GENERAL:  He is without distress, lying at about a 45-degree angle.  VITAL SIGNS:  T-max 97.2.  Blood pressure 147/65, then down to 131/53.  Heart rate 42, down to 36.  Pulse oximetry 97% on room air.  HEENT:  Normocephalic without trauma.  Pupils are reactive.  Mouth is dry.  He is wearing dentures.  NECK:  Neck is supple with no lymphadenopathy.  LUNGS:  Lungs are clear to auscultation bilaterally, without rales, rhonchi  or wheezing.  CARDIOVASCULAR:  Cardiovascularly, he is bradycardic.  ABDOMEN:  Abdomen is round with no umbilicus secondary to surgery.  He has  good bowel sounds and abdomen is soft without tenderness.  GENITOURINARY:  He has normal external genitalia.  EXTREMITIES:  Trace bilateral edema, right greater than left.  Old graft  site on the right and this is small.  SKIN:  Skin warm and dry.  NEUROLOGIC:  Neurologically, he is alert and oriented with no focal deficits  noted on exam.   SIGNIFICANT DATA:  Complete metabolic profile, CBC, cardiac enzymes,  troponin and chest x-ray all are pending.   EKG shows sinus bradycardia with left bundle branch block.  No old for  comparison.   IMPRESSION:  1. Symptomatic bradycardia.  He has no  contribution from his medications.     Electrolytes are pending.  ?Sick sinus syndrome and the need for     permanent pacemaker is most likely.  He is hemodynamically stable.  2. Diet-controlled diabetes mellitus.  3. Hypertension -- blood pressure okay.  Hold blood pressure medications.  4. History of prostate cancer.  This is stable.   PLAN:  Admit to telemetry.  Await labs as stated above.  He will be  externally paced if needed and a cardiology consult will be obtained in the  morning.                                                Lilla Shook, M.D.    SEJ/MEDQ  D:  02/18/2004  T:  02/19/2004  Job:  [id number]

## 2011-03-24 NOTE — Op Note (Signed)
NAME:  Richard Charles, Richard Charles.                          ACCOUNT NO.:  [id number]   MEDICAL RECORD NO.:  [id number]                   PATIENT TYPE:  INP   LOCATION:  3731                                 FACILITY:  [facility]   PHYSICIAN:  Duke Salvia, M.D.               DATE OF BIRTH:  [birth year]   DATE OF PROCEDURE:  02/19/2004  DATE OF DISCHARGE:                                 OPERATIVE REPORT   PREOPERATIVE DIAGNOSIS:  Syncope, prior myocardial infarction, nonsustained  ventricular tachycardia.   POSTOPERATIVE DIAGNOSIS:  Syncope, prior myocardial infarction, nonsustained  ventricular tachycardia.   PROCEDURE PERFORMED:  Invasive electrophysiologic study.    Following the obtaining of informed consent, the patient was brought to the  electrophysiology laboratory and placed on the fluoroscopic table in supine  position.  After routine prep and drape, cardiac catheterization was  performed with local anesthesia and conscious sedation.  Noninvasive blood  pressure monitoring, transcutaneous oxygen saturation monitoring were  performed continuously throughout the procedure.  Following the procedure,  the catheters were removed.  Hemostasis was obtained and the patient was  then transferred to the floor in stable condition.   CATHETERS:  A 5 French quadripolar catheter was inserted via the left  femoral vein via the right ventricular apex.  A 5 French quadripolar catheter was inserted via the left femoral vein to  the high right atrium and subsequently moved to the AV junction.   Surface leads 1, aVF, and V1 were monitored continuously throughout the  procedure.  Following insertion of the catheters, the stimulation included  incremental atrial pacing, incremental ventricular pacing, single  ventricular extrastimuli from the right ventricular apex at a pace cycle  length 400:600 msec, double and triple ventricular extrastimuli from the  right ventricular apex at a pace cycle length 500 and  400 msec.   RESULTS:  1. Surface electrocardiogram basic measurements.  Rhythm is sinus.  Cycle length AA is 678 msec, R-R is 1340 msec.  P-R  interval is 228 msec.  QRS duration is 153 msec.  QT interval is 581 msec  and P-wave duration is 153 msec.  Bundle branch block is present __________.  Pre-excitation is absent.   AH INTERVAL:  AH interval on conducted beats was 125 msec.  HV on conducted  beats was 77 msec.   AV NODAL FUNCTION:  2:1 conduction block was present at the onset of the  study.  With atrial pacing at 300 msec 3:1 block was noted.  VA conduction  was 1:1 at 600 msec with VA Wenckebach at 450 msec.   VENTRICULAR RESPONSE TO PROGRAMMED STIMULATION:  Effective refractory period  at the right ventricular apex at 500 msec was 290 msec and at 400 msec was  270 msec.  The closest coupling interval at the right ventricular apex at 500 msec was  310:270:230 and at 400 msec was 290:240:220.   Arrhythmias induced:  None.  IMPRESSION:  1. Normal sinus function.  2. Prolonged interatrial conduction times.  3. AV nodal function was abnormal with conduction block noted.  There was     occasional Wenckebach periodicity at the onset of the study suggesting     that some of the conduction block was intranodal.  4. Modest prolongation of His-Purkinje system function.  5. No accessory pathway, normal ventricular response to programmed atrial     stimulation.   SUMMARY:  In conclusion, the results of the electrophysiologic testing  failed to identify a substrate for ventricular tachycardia with Mr. Mccadden.  Given his heart block that has been demonstrated, dual chamber pacing would  be the appropriate intervention at this time.                                               Duke Salvia, M.D.    SCK/MEDQ  D:  02/19/2004  T:  02/22/2004  Job:  [id number]   cc:   Armanda Magic, M.D.  [address]  [place], [place] [address]  Fax: [phone]

## 2011-03-24 NOTE — Cardiovascular Report (Signed)
NAME:  DOLAN, XIA.                          ACCOUNT NO.:  [id number]   MEDICAL RECORD NO.:  [id number]                   PATIENT TYPE:  INP   LOCATION:  3731                                 FACILITY:  [facility]   PHYSICIAN:  Carole Binning, M.D. [facility]         DATE OF BIRTH:  1931-06-24   DATE OF PROCEDURE:  02/22/2004  DATE OF DISCHARGE:                              CARDIAC CATHETERIZATION   PROCEDURE PERFORMED:  Placement of temporary transvenous pacemaker.   INDICATION:  Mr. Wigfall is a 77 year old male with type 2 second-degree heart  block.  The patient was undergoing placement of a permanent dual-chamber  pacemaker by Dr. Mayford Knife.  During attempts to place his ventricular leads, he  developed prolonged asystole which was not responsive to external pacemaker.  I was therefore called urgently to assist with placement of a temporary  pacemaker to allow Dr. Mayford Knife to safely place permanent pacemaker.   PROCEDURAL NOTE:  Under sterile conditions, a 6 French sheath was placed in  the right femoral vein.  A temporary transvenous pacemaker was advanced  under fluoroscopic guidance and positioned in the right ventricular apex.  Excellent sensing and capture were confirmed.  The pacemaker was secured in  position.   COMPLICATIONS:  None.   RESULTS:  Successful placement of temporary transvenous pacemaker via the  right femoral vein.                                               Carole Binning, M.D. [facility]    MWP/MEDQ  D:  02/22/2004  T:  02/22/2004  Job:  [id number]

## 2011-03-29 ENCOUNTER — Encounter: Payer: Self-pay | Admitting: Internal Medicine

## 2011-04-10 ENCOUNTER — Telehealth: Payer: Self-pay | Admitting: Internal Medicine

## 2011-04-10 NOTE — Telephone Encounter (Signed)
Pt having irregular heartbeats since 2 months ago , due to see klein in June however hasn't called till now so he is booked until 7-11-pls advise

## 2011-04-20 NOTE — Telephone Encounter (Signed)
LMTCB

## 2011-04-20 NOTE — Telephone Encounter (Signed)
Pt wife is calling again because pt still not feeling well and b/p is still low and they still have not received a call back from the call on the 4th and she is worried about him

## 2011-04-21 ENCOUNTER — Encounter: Payer: Self-pay | Admitting: Internal Medicine

## 2011-04-21 ENCOUNTER — Ambulatory Visit (INDEPENDENT_AMBULATORY_CARE_PROVIDER_SITE_OTHER): Payer: Medicare Other | Admitting: Internal Medicine

## 2011-04-21 VITALS — BP 142/80 | HR 68 | Ht 69.0 in | Wt 213.0 lb

## 2011-04-21 DIAGNOSIS — I2589 Other forms of chronic ischemic heart disease: Secondary | ICD-10-CM

## 2011-04-21 DIAGNOSIS — I442 Atrioventricular block, complete: Secondary | ICD-10-CM

## 2011-04-21 DIAGNOSIS — I255 Ischemic cardiomyopathy: Secondary | ICD-10-CM

## 2011-04-21 DIAGNOSIS — I959 Hypotension, unspecified: Secondary | ICD-10-CM | POA: Insufficient documentation

## 2011-04-21 NOTE — Assessment & Plan Note (Signed)
I will decrease his aspirin from 325-81. We'll check an echo to look at his left ventricular function

## 2011-04-21 NOTE — Assessment & Plan Note (Signed)
The patient's device was interrogated.  The information was reviewed. No changes were made in the programming.    

## 2011-04-21 NOTE — Telephone Encounter (Signed)
Spoke with pt c/o dizziness b/p yesterday was 136/58 also c/o episodes of irreg heart rate which may last  an hour or 1/2 day  varies.Appt made with dr Graciela Husbands for today at 2:45 pm. Pt aware./cy

## 2011-04-21 NOTE — Assessment & Plan Note (Signed)
Patient has episodic hypotension without clear ssociation to changes in position or meals. Will have him take his Altace at night. Fact it is nonexertional make me think that it is not a manifestation of right-sided ischemia

## 2011-04-21 NOTE — Assessment & Plan Note (Signed)
100% ventricularly paced 

## 2011-04-21 NOTE — Progress Notes (Signed)
...    HPI  Richard Charles is a 77 y.o. male   seen in followup for pacemaker implanted for complete heart block in 2005 by Dr. Gevena Barre me apparently)  He has a history of coronary artery disease and underwent bypass grafting in [facility] [place] 6 years ago. He presented at that time with congestive heart failure shock and a probable MI. A year later after he moved back to [facility] he developed bradycardia and underwent pacemaker implantation  .  The patient denies SOB, chest pain, edema or palpitations  Echo 2005 20%colon apparently he had an echo at Dr. Norris Cross office that was 35%-and 55% (???). He has modest symptoms of exercise intolerance. He is post prandial sleepiness and intermittent episodes of hypotension associated with fatigue. He denies chest pain.   No past medical history on file.  No past surgical history on file.  Current Outpatient Prescriptions  Medication Sig Dispense Refill  . aspirin 325 MG tablet Take 325 mg by mouth daily.        Marland Kitchen atorvastatin (LIPITOR) 20 MG tablet Take 20 mg by mouth daily.        . carvedilol (COREG) 12.5 MG tablet Take 12.5 mg by mouth 2 (two) times daily with a meal.        . cyclobenzaprine (FLEXERIL) 10 MG tablet Take 10 mg by mouth as needed.        . ramipril (ALTACE) 5 MG capsule Take 5 mg by mouth daily.          No Known Allergies  Review of Systems negative except from HPI and PMH  Physical Exam Well developed and well nourished obese in no acute distress HENT normal E scleral and icterus clear Neck Supple JVP flat; carotids brisk and full Clear to ausculation Regular rate and rhythm, no murmurs gallops or rub Soft with active bowel sounds No clubbing cyanosis mild edema right greater than left Alert and oriented, grossly normal motor and sensory function Skin Warm and Dry  ECG  Assessment and  Plan

## 2011-04-21 NOTE — Assessment & Plan Note (Signed)
If his left ventricular function remains depressed, we'll add Aldactone. We also need to consider device revision with either left ventricular pacing and or ICD insertion

## 2011-04-21 NOTE — Patient Instructions (Signed)
Your physician has recommended you make the following change in your medication:  1) Decrease Aspirin to 81mg  once daily. 2) Take ramipril 5mg  one capsule every evening  Your physician has requested that you have an echocardiogram. Echocardiography is a painless test that uses sound waves to create images of your heart. It provides your doctor with information about the size and shape of your heart and how well your heart's chambers and valves are working. This procedure takes approximately one hour. There are no restrictions for this procedure.   Your physician wants you to follow-up in: 6 months. You will receive a reminder letter in the mail two months in advance. If you don't receive a letter, please call our office to schedule the follow-up appointment.

## 2011-05-01 ENCOUNTER — Ambulatory Visit (HOSPITAL_COMMUNITY): Payer: Medicare Other | Attending: Internal Medicine | Admitting: Radiology

## 2011-05-01 DIAGNOSIS — I079 Rheumatic tricuspid valve disease, unspecified: Secondary | ICD-10-CM | POA: Insufficient documentation

## 2011-05-01 DIAGNOSIS — I2510 Atherosclerotic heart disease of native coronary artery without angina pectoris: Secondary | ICD-10-CM | POA: Insufficient documentation

## 2011-05-01 DIAGNOSIS — I059 Rheumatic mitral valve disease, unspecified: Secondary | ICD-10-CM | POA: Insufficient documentation

## 2011-05-01 DIAGNOSIS — I442 Atrioventricular block, complete: Secondary | ICD-10-CM

## 2011-05-01 DIAGNOSIS — I509 Heart failure, unspecified: Secondary | ICD-10-CM

## 2011-05-01 DIAGNOSIS — I255 Ischemic cardiomyopathy: Secondary | ICD-10-CM

## 2011-05-04 ENCOUNTER — Telehealth: Payer: Self-pay | Admitting: Internal Medicine

## 2011-05-04 ENCOUNTER — Encounter: Payer: Self-pay | Admitting: *Deleted

## 2011-05-04 ENCOUNTER — Encounter: Payer: PRIVATE HEALTH INSURANCE | Admitting: *Deleted

## 2011-05-04 NOTE — Telephone Encounter (Signed)
No restricions Richard Charles

## 2011-05-04 NOTE — Telephone Encounter (Signed)
Letter faxed to the patient at his home #.

## 2011-05-04 NOTE — Telephone Encounter (Signed)
Pt calling to check on status of letter of release.

## 2011-05-04 NOTE — Telephone Encounter (Signed)
The patient needs a letter stating that he is ok to do PT with his orthopedist- targeting his neck and lower back. I will review with Dr. Graciela Husbands and call the patient back.

## 2011-05-30 ENCOUNTER — Other Ambulatory Visit: Payer: Self-pay | Admitting: Internal Medicine

## 2011-06-07 ENCOUNTER — Encounter: Payer: Self-pay | Admitting: *Deleted

## 2011-07-11 ENCOUNTER — Inpatient Hospital Stay (HOSPITAL_COMMUNITY)
Admission: EM | Admit: 2011-07-11 | Discharge: 2011-07-12 | DRG: 293 | Disposition: A | Discharge status: Home / Self Care | Payer: Medicare Other | Attending: Cardiology | Admitting: Cardiology

## 2011-07-11 ENCOUNTER — Emergency Department (HOSPITAL_COMMUNITY): Payer: Medicare Other

## 2011-07-11 ENCOUNTER — Telehealth: Payer: Self-pay | Admitting: Internal Medicine

## 2011-07-11 DIAGNOSIS — R0989 Other specified symptoms and signs involving the circulatory and respiratory systems: Secondary | ICD-10-CM

## 2011-07-11 DIAGNOSIS — I2510 Atherosclerotic heart disease of native coronary artery without angina pectoris: Secondary | ICD-10-CM | POA: Diagnosis present

## 2011-07-11 DIAGNOSIS — R5383 Other fatigue: Secondary | ICD-10-CM

## 2011-07-11 DIAGNOSIS — I5031 Acute diastolic (congestive) heart failure: Principal | ICD-10-CM | POA: Diagnosis present

## 2011-07-11 DIAGNOSIS — Z950 Presence of cardiac pacemaker: Secondary | ICD-10-CM

## 2011-07-11 DIAGNOSIS — I509 Heart failure, unspecified: Secondary | ICD-10-CM | POA: Diagnosis present

## 2011-07-11 DIAGNOSIS — R0609 Other forms of dyspnea: Secondary | ICD-10-CM

## 2011-07-11 DIAGNOSIS — I10 Essential (primary) hypertension: Secondary | ICD-10-CM | POA: Diagnosis present

## 2011-07-11 DIAGNOSIS — Z951 Presence of aortocoronary bypass graft: Secondary | ICD-10-CM

## 2011-07-11 DIAGNOSIS — E119 Type 2 diabetes mellitus without complications: Secondary | ICD-10-CM | POA: Diagnosis present

## 2011-07-11 DIAGNOSIS — R5381 Other malaise: Secondary | ICD-10-CM

## 2011-07-11 DIAGNOSIS — E669 Obesity, unspecified: Secondary | ICD-10-CM | POA: Diagnosis present

## 2011-07-11 DIAGNOSIS — Z8546 Personal history of malignant neoplasm of prostate: Secondary | ICD-10-CM

## 2011-07-11 LAB — COMPREHENSIVE METABOLIC PANEL
ALT: 23 U/L (ref 0–53)
Alkaline Phosphatase: 44 U/L (ref 39–117)
BUN: 14 mg/dL (ref 6–23)
CO2: 28 mEq/L (ref 19–32)
Chloride: 105 mEq/L (ref 96–112)
GFR calc Af Amer: >60 mL/min (ref >60)
Glucose, Bld: 131 mg/dL — ABNORMAL HIGH (ref 70–99)
Potassium: 4.9 mEq/L (ref 3.5–5.1)
Sodium: 141 mEq/L (ref 135–145)
Total Bilirubin: 0.6 mg/dL (ref 0.3–1.2)

## 2011-07-11 LAB — DIFFERENTIAL
Basophils Absolute: 0.1 10*3/uL (ref 0.0–0.1)
Eosinophils Absolute: 0.2 10*3/uL (ref 0.0–0.7)
Lymphocytes Relative: 25 % (ref 12–46)
Lymphs Abs: 2.5 10*3/uL (ref 0.7–4.0)
Neutrophils Relative %: 63 % (ref 43–77)

## 2011-07-11 LAB — PROTIME-INR
INR: 1.1 (ref 0.00–1.49)
Prothrombin Time: 14.4 seconds (ref 11.6–15.2)

## 2011-07-11 LAB — POCT I-STAT TROPONIN I: Troponin i, poc: 0.01 ng/mL (ref 0.00–0.08)

## 2011-07-11 LAB — URINALYSIS, ROUTINE W REFLEX MICROSCOPIC
Bilirubin Urine: NEGATIVE
Glucose, UA: NEGATIVE mg/dL
Hgb urine dipstick: NEGATIVE
Ketones, ur: NEGATIVE mg/dL
Protein, ur: NEGATIVE mg/dL
Urobilinogen, UA: 0.2 mg/dL (ref 0.0–1.0)

## 2011-07-11 LAB — CBC
MCV: 92.9 fL (ref 78.0–100.0)
Platelets: 199 10*3/uL (ref 150–400)
RBC: 4.67 MIL/uL (ref 4.22–5.81)
WBC: 10 10*3/uL (ref 4.0–10.5)

## 2011-07-11 LAB — PRO B NATRIURETIC PEPTIDE: Pro B Natriuretic peptide (BNP): 872.4 pg/mL — ABNORMAL HIGH (ref 0–450)

## 2011-07-11 LAB — CARDIAC PANEL(CRET KIN+CKTOT+MB+TROPI): Troponin I: <0.3 ng/mL (ref <0.30)

## 2011-07-11 NOTE — Telephone Encounter (Signed)
LMTC

## 2011-07-11 NOTE — Telephone Encounter (Signed)
Pt's wife calling re pt having sob/fatigue last couple days, worse today, pls call

## 2011-07-12 DIAGNOSIS — I5031 Acute diastolic (congestive) heart failure: Secondary | ICD-10-CM

## 2011-07-12 LAB — BASIC METABOLIC PANEL
BUN: 11 mg/dL (ref 6–23)
CO2: 27 mEq/L (ref 19–32)
Chloride: 105 mEq/L (ref 96–112)
Creatinine, Ser: 0.69 mg/dL (ref 0.50–1.35)
GFR calc Af Amer: >60 mL/min (ref >60)
Potassium: 3.6 mEq/L (ref 3.5–5.1)

## 2011-07-12 LAB — HEMOGLOBIN A1C
Hgb A1c MFr Bld: 6.2 % — ABNORMAL HIGH (ref <5.7)
Mean Plasma Glucose: 131 mg/dL — ABNORMAL HIGH (ref <117)

## 2011-07-12 LAB — CARDIAC PANEL(CRET KIN+CKTOT+MB+TROPI)
Relative Index: 3.4 — ABNORMAL HIGH (ref 0.0–2.5)
Total CK: 157 U/L (ref 7–232)
Troponin I: <0.3 ng/mL (ref <0.30)

## 2011-07-12 LAB — OCCULT BLOOD X 1 CARD TO LAB, STOOL: Fecal Occult Bld: NEGATIVE

## 2011-07-13 NOTE — Telephone Encounter (Signed)
The patient was admitted overnight to [facility] on 9/4 for acute diastolic heart failure.

## 2011-07-20 ENCOUNTER — Encounter: Payer: PRIVATE HEALTH INSURANCE | Admitting: *Deleted

## 2011-07-21 NOTE — H&P (Signed)
NAME:  [name], [name] NO.:  [id number]  MEDICAL RECORD NO.:  [id number]  LOCATION:  4708                         FACILITY:  [facility]  PHYSICIAN:  Madolyn Frieze. Jens Som, MD, FACCDATE OF BIRTH:  1930-12-08  DATE OF ADMISSION:  07/11/2011 DATE OF DISCHARGE:                             HISTORY & PHYSICAL   PRIMARY CARE PHYSICIAN:  Georgann Housekeeper, MD at [facility].  PRIMARY CARDIOLOGIST:  Duke Salvia, MD, [facility].  FORMER CARDIOLOGIST:  Dr. Eleonore Chiquito in [place], [place].  CHIEF COMPLAINT:  Fatigue and dyspnea on exertion.  HISTORY OF PRESENT ILLNESS:  Richard Charles is a 77 year old male with a history of coronary artery disease.  He reports a several-month history of increased fatigue and increasing dyspnea on exertion.  His weight has not changed significantly.  He describes orthopnea, but not PND.  He has some daytime lower extremity edema, but does not wake up with it.  His dyspnea on exertion and fatigue progressed to the point to where his activity is significantly limited.  He does not feel like doing anything. When he does ambulate, he is not able to ambulate more than 200 feet without stopping to rest.  This is a significant decrease in his activity level.  He has not had chest pain.  Today, his symptoms just became too much and he came to the emergency room.  Currently, in the emergency room, at rest, on O2, he is asymptomatic.  PAST MEDICAL HISTORY: 1. Status post aortocoronary bypass surgery x5 in 2005 at [facility] near [place], [place]. 2. Status post echocardiogram on May 01, 2011, showing an EF of 50%     to 55% with grade 1 diastolic dysfunction, calcified mitral valve     annulus, and mildly dilated left atrium.  Hypokinesis of the     inferior myocardium was seen. 3. Hypertension. 4. Diabetes. 5. Status post Medtronic impulse dual-lead pacemaker in 2005 for     symptomatic second-degree type 2 AV block. 6.  History of prostate cancer. 7. Obesity.  SURGICAL HISTORY:  He is status post cardiac catheterization as well as bypass surgery, pacemaker insertion, electrophysiology study, right tibial repair, right hand surgery, and umbilical hernia repair.  ALLERGIES:  No known drug allergies.  CURRENT MEDICATIONS: 1. Polyethylene glycol daily p.r.n. 2. Vicodin 7.5 mg p.r.n. 3. Multivitamin daily. 4. Vitamin D daily. 5. Altace 5 mg daily. 6. Gabapentin 300 mg b.i.d. 7. Fish oil 1000 mg 4 capsules q.a.m. 8. Coreg 12.5 mg b.i.d. 9. Lipitor 20 mg at bedtime.  SOCIAL HISTORY:  He lives in [place] with his significant other.  He is retired from [employer].  He quit tobacco greater than 20 years ago with approximately 30 pack-year history and denies alcohol or drug abuse.  FAMILY HISTORY:  Noncontributory.  REVIEW OF SYSTEMS:  He has had no weight change.  The fatigue is described above, but states he does not describe weakness.  He has some chronic arthralgias and joint pain.  He does not describe melena or any significant reflux symptoms.  His heart has been skipping for years and this has not changed recently.  He has daytime  edema only.  He has not had presyncope or syncope.  Full 14-point review of systems is otherwise negative except as stated in the HPI.  PHYSICAL EXAMINATION:  VITAL SIGNS:  Temperature is 98.2, blood pressure 169/96, heart rate 92, respiratory rate 28, O2 saturation 94% on room air. GENERAL:  He is a well-developed obese white male in no acute distress at rest. HEENT:  Normal. NECK:  There is no lymphadenopathy, thyromegaly, bruit, or JVD noted, but JVD is difficult to assess because of body habitus. CV:  His heart is slightly irregular in rate and rhythm with an S1 and S2 and a systolic murmur is noted at 2/6.  Distal pulses are intact in all four extremities. LUNGS:  He has some rales, but no crackles are noted. SKIN:  No rashes or lesions are noted. ABDOMEN:   Soft and nontender with active bowel sounds. EXTREMITIES:  There is no cyanosis or clubbing and only trace edema. MUSCULOSKELETAL:  There is no joint deformity or effusions and no spine or CVA tenderness is noted. NEURO:  He is alert and oriented.  Cranial nerves II through XII grossly intact.  Chest x-ray shows COPD and scarring, but no acute disease.  EKG is sinus rhythm plus or minus atrial pacing plus or minus ventricular pacing.  LABORATORY VALUES:  Hemoglobin 14.9, hematocrit 43.4, WBC is 10.0, platelets 199, INR 1.0.  Sodium 141, potassium 4.9, chloride 105, CO2 of 28, BUN 14, creatinine 0.92, glucose 131.  Other CMET values within normal limits except for albumin 3.4, CK-MB 179/6.5 with troponin-I of 0.01 and index 3.6.  BNP 872.4.  UA negative.  IMPRESSION:  Mr. Richard Charles was seen today by Dr. Jens Som, the patient evaluated and the data reviewed.  He is a 77 year old male with a past medical history significant for borderline diabetes, hypertension, hyperlipidemia, and coronary artery disease status post coronary artery bypass grafting.  He has a history of pacemaker and we are asked to evaluate him for complaints of increased fatigue and dyspnea on exertion.  It has been progressive over the last several months, but more severe for the last week.  He has had no chest pain.  His chest x- ray shows only COPD.  His troponin is negative x1 and his hemoglobin is within normal limits, but his pro BNP is elevated at 872.  His EKG shows  AV pacing with PACs.  The etiology of his symptoms is unclear. They are possibly secondary to mild diastolic CHF.  We plan gentle diuresis and will follow his renal function closely.  His pacemaker has been interrogated and it showed mode switches less than 0.1% of the time with no atrial high rate episodes and no ventricular high rate episodes.  He is A sensing and V pacing 34% of the time and he is AV pacing 66% of the time.  He has had 13,000 PVCs  since his last interrogation.  We will check a TSH and continue to follow him closely on telemetry.     Theodore Demark, PA-C   ______________________________ Madolyn Frieze. Jens Som, MD, [facility]    RB/MEDQ  D:  07/11/2011  T:  07/12/2011  Job:  [id number]  Electronically Signed by Theodore Demark PA-C on 07/20/2011 03:28:11 PM Electronically Signed by Olga Millers MD [facility] on 07/21/2011 12:54:12 PM

## 2011-07-24 ENCOUNTER — Encounter: Payer: Self-pay | Admitting: *Deleted

## 2011-07-27 ENCOUNTER — Ambulatory Visit (INDEPENDENT_AMBULATORY_CARE_PROVIDER_SITE_OTHER): Payer: Medicare Other | Admitting: *Deleted

## 2011-07-27 ENCOUNTER — Encounter: Payer: Self-pay | Admitting: Internal Medicine

## 2011-07-27 ENCOUNTER — Other Ambulatory Visit: Payer: Self-pay | Admitting: Internal Medicine

## 2011-07-27 DIAGNOSIS — I442 Atrioventricular block, complete: Secondary | ICD-10-CM

## 2011-07-29 LAB — REMOTE PACEMAKER DEVICE
AL THRESHOLD: 0.5 V
BAMS-0001: 175 {beats}/min
RV LEAD IMPEDENCE PM: 542 Ohm
RV LEAD THRESHOLD: 0.625 V

## 2011-07-31 NOTE — Discharge Summary (Signed)
  NAME:  Richard Charles, Richard Charles NO.:  [id number]  MEDICAL RECORD NO.:  [id number]  LOCATION:  4708                         FACILITY:  [facility]  PHYSICIAN:  Duke Salvia, MD, FACCDATE OF BIRTH:  September 24, 1931  DATE OF ADMISSION:  07/11/2011 DATE OF DISCHARGE:  07/12/2011                              DISCHARGE SUMMARY   PROCEDURES:  Two-view chest x-ray.  PRIMARY FINAL DISCHARGE DIAGNOSIS:  Shortness of breath, felt secondary to acute diastolic congestive heart failure.  SECONDARY DIAGNOSES: 1. Status post aortocoronary bypass surgery x5 in 2005, in [place].     No further details available. 2. Preserved left ventricular function with an ejection fraction of 50-     55% with grade 1 diastolic dysfunction and a calcified mitral valve     annulus by echocardiogram in June 2012. 3. Diabetes. 4. Hypertension. 5. History of symptomatic second-degree type 2 atrioventricular block,     status post Medtronic dual-lead pacemaker. 6. History of prostate cancer. 7. Obesity. 8. Status post right tibial repair, right hand surgery, and umbilical     hernia repair.  TIME AT DISCHARGE:  32 minutes.  HOSPITAL COURSE:  [name] is a 77 year old male with a history of coronary artery disease.  He had increasing dyspnea on exertion and fatigue, and came to the hospital.  He was admitted for further evaluation.  His BNP was somewhat elevated at 872.  He was given some IV Lasix.  His pacemaker was interrogated and he was having no critical arrhythmias, only frequent PACs and some PVCs.  On July 12, 2011, [name] felt much better.  His weight was down about a kilogram and symptomatically, he was much improved.  He had some elevation in his CK-MBs to a peak of 204/6.8, but all troponins were negative.  His O2 saturation was within normal limits on room air and his hemoglobin A1c was only minimally elevated at 6.2.  On July 12, 2011, he was evaluated by Dr. Graciela Husbands and  considered stable for discharge, to follow up as an outpatient.  DISCHARGE INSTRUCTIONS: 1. His activity level is to be increased gradually. 2. He is encouraged to stick to a low-sodium, diabetic diet. 3. He is to weigh himself daily. 4. He is to follow up with Dr. Graciela Husbands and an appointment will be     arranged. 5. He is to follow up with Dr. Donette Larry as needed.  DISCHARGE MEDICATIONS: 1. Aspirin 81 mg a day. 2. Lasix 20 mg daily p.r.n. 3. Lipitor 20 mg daily. 4. Coreg 12.5 mg b.i.d. 5. Fish oil 1000 mg four capsules daily. 6. Gabapentin 300 mg b.i.d. 7. Vicodin 7.5 q.6 h. p.r.n. 8. Multivitamin daily. 9. Polyethylene glycol daily p.r.n. 10.Altace 5 mg daily. 11.Vitamin D OTC daily.     Theodore Demark, PA-C   ______________________________ Duke Salvia, MD, [facility]    RB/MEDQ  D:  07/12/2011  T:  07/13/2011  Job:  [id number]  cc:   Georgann Housekeeper, MD  Electronically Signed by Theodore Demark PA-C on 07/20/2011 03:26:16 PM Electronically Signed by Sherryl Manges MD [facility] on 07/31/2011 04:03:23 PM

## 2011-08-04 NOTE — Progress Notes (Signed)
Pacer checked in office  

## 2011-08-10 ENCOUNTER — Encounter: Payer: Self-pay | Admitting: *Deleted

## 2011-08-24 ENCOUNTER — Encounter: Payer: Self-pay | Admitting: Internal Medicine

## 2011-08-30 ENCOUNTER — Ambulatory Visit (INDEPENDENT_AMBULATORY_CARE_PROVIDER_SITE_OTHER): Payer: Medicare Other | Admitting: Internal Medicine

## 2011-08-30 ENCOUNTER — Encounter: Payer: Self-pay | Admitting: Internal Medicine

## 2011-08-30 DIAGNOSIS — I255 Ischemic cardiomyopathy: Secondary | ICD-10-CM

## 2011-08-30 DIAGNOSIS — I442 Atrioventricular block, complete: Secondary | ICD-10-CM

## 2011-08-30 DIAGNOSIS — Z950 Presence of cardiac pacemaker: Secondary | ICD-10-CM

## 2011-08-30 DIAGNOSIS — I2589 Other forms of chronic ischemic heart disease: Secondary | ICD-10-CM

## 2011-08-30 LAB — PACEMAKER DEVICE OBSERVATION
AL AMPLITUDE: 1 mv
AL IMPEDENCE PM: 589 Ohm
AL THRESHOLD: 0.5 V
BATTERY VOLTAGE: 2.67 V
RV LEAD IMPEDENCE PM: 538 Ohm

## 2011-08-30 NOTE — Assessment & Plan Note (Signed)
Stable post pacemaker 

## 2011-08-30 NOTE — Progress Notes (Signed)
  HPI  Richard Charles is a 77 y.o. male seen in followup for pacemaker implanted for complete heart block in 2005 by Dr. Gevena Barre me apparently)  He has a history of coronary artery disease and underwent bypass grafting in [facility] [place] 6 years ago. He presented at that time with congestive heart failure shock and a probable MI. A year later after he moved back to [facility] he developed bradycardia and underwent pacemaker implantation  .  The patient denies SOB, chest pain, edema or palpitations  Echo 2005 20%colon apparently he had an echo at Dr. Norris Cross office that was 35%-and 55% (???). He has modest symptoms of exercise intolerance. He is post prandial sleepiness and intermittent episodes of hypotension associated with fatigue. He denies chest pain.      Past Medical History  Diagnosis Date  . Hypertension   . Coronary artery disease   . Prostate cancer   . Diabetes mellitus     diet controlled type II  . Emphysema     low-grade    Past Surgical History  Procedure Date  . Coronary artery bypass graft     x 5 vessels  . Percutaneous pinning tibia fracture     right  . Umbilical hernia repair   . Skin cyst removal   . Cardiac assist device insertion     Medtronic     Current Outpatient Prescriptions  Medication Sig Dispense Refill  . aspirin EC 81 MG tablet Take 1 tablet (81 mg total) by mouth daily.      Marland Kitchen atorvastatin (LIPITOR) 20 MG tablet Take 20 mg by mouth daily.        . carvedilol (COREG) 12.5 MG tablet TAKE 1 TABLET TWICE DAILY  60 tablet  6  . cyclobenzaprine (FLEXERIL) 10 MG tablet Take 10 mg by mouth as needed.        . furosemide (LASIX) 20 MG tablet Take by mouth as needed.      . gabapentin (NEURONTIN) 600 MG tablet Take 300 mg by mouth 2 (two) times daily.       Marland Kitchen HYDROcodone-acetaminophen (LORTAB) 7.5-500 MG per tablet Take by mouth as needed.      . polyethylene glycol powder (GLYCOLAX/MIRALAX) powder Take 17 g by mouth daily.       . ramipril  (ALTACE) 5 MG capsule Take one capsule by mouth every evening      . sildenafil (VIAGRA) 100 MG tablet Take 100 mg by mouth daily as needed.          No Known Allergies  Review of Systems negative except from HPI and PMH  Physical Exam Well developed and well nourished in no acute distress HENT normal E scleral and icterus clear Neck Supple JVP flat; carotids brisk and full Clear to ausculation Regular rate and rhythm, no murmurs gallops or rub Soft with active bowel sounds No clubbing cyanosis and edema Alert and oriented, grossly normal motor and sensory function Skin Warm and Dry  ECG  Assessment and  Plan

## 2011-08-30 NOTE — Assessment & Plan Note (Signed)
The patient's device was interrogated.  The information was reviewed. No changes were made in the programming.    

## 2011-08-30 NOTE — Assessment & Plan Note (Signed)
Stable on current medication.  

## 2011-10-23 ENCOUNTER — Encounter: Payer: Medicare Other | Admitting: Internal Medicine

## 2011-11-14 ENCOUNTER — Other Ambulatory Visit: Payer: Self-pay | Admitting: *Deleted

## 2011-11-14 MED ORDER — CARVEDILOL 12.5 MG PO TABS: 12.5000 mg | ORAL_TABLET | Freq: Two times a day (BID) | ORAL | Status: DC

## 2011-11-30 ENCOUNTER — Encounter: Payer: Medicare Other | Admitting: *Deleted

## 2011-11-30 IMAGING — US US ABDOMEN COMPLETE
1 series · 14 of 25 positions shown · non-contrast
Comparison: CT 02/07/2006

CLINICAL DATA: Epigastric abdominal pain

COMPLETE ABDOMINAL ULTRASOUND

[Series 1: us abdomen complete · 0.37mm/px · 14 of 77 slices shown]
[im 1/77]
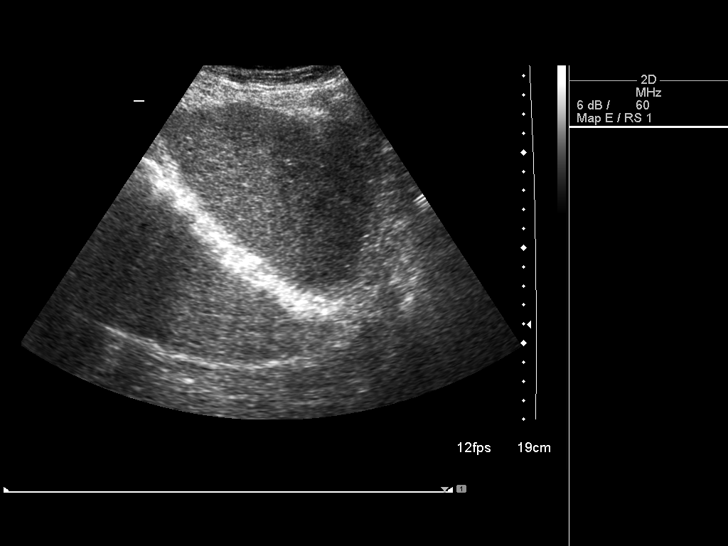
[im 7/77]
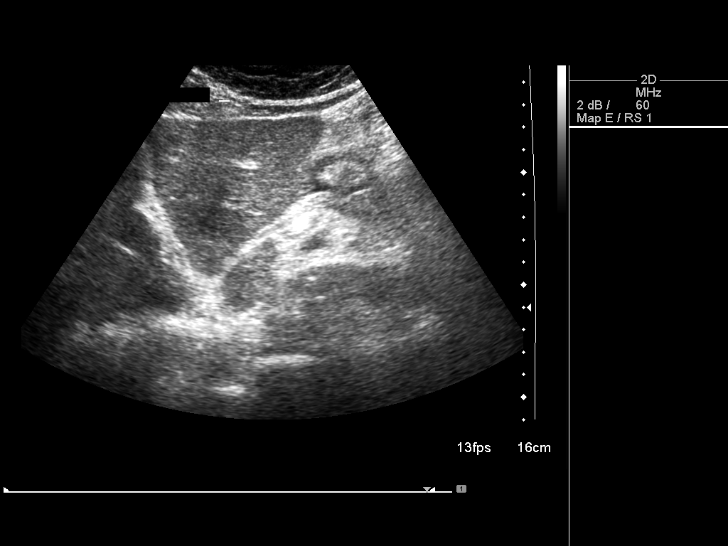
[im 13/77]
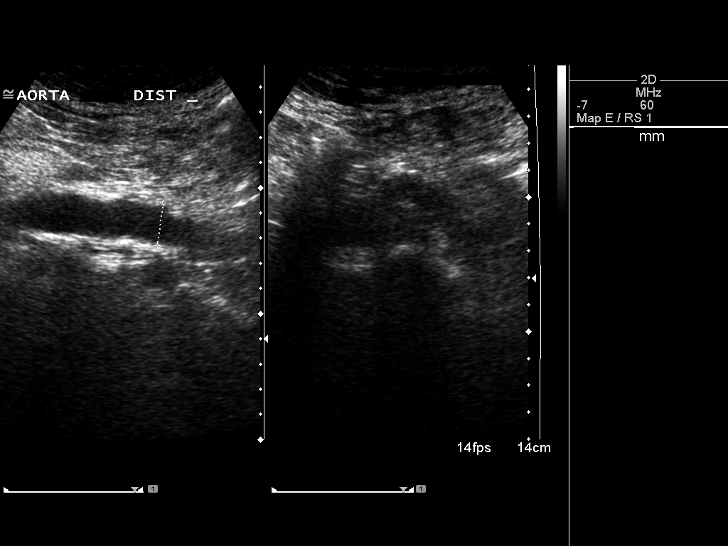
[im 20/77]
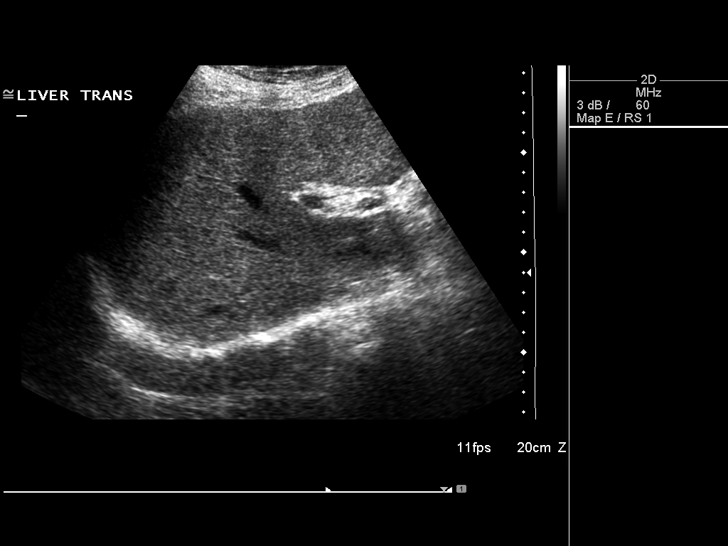
[im 26/77]
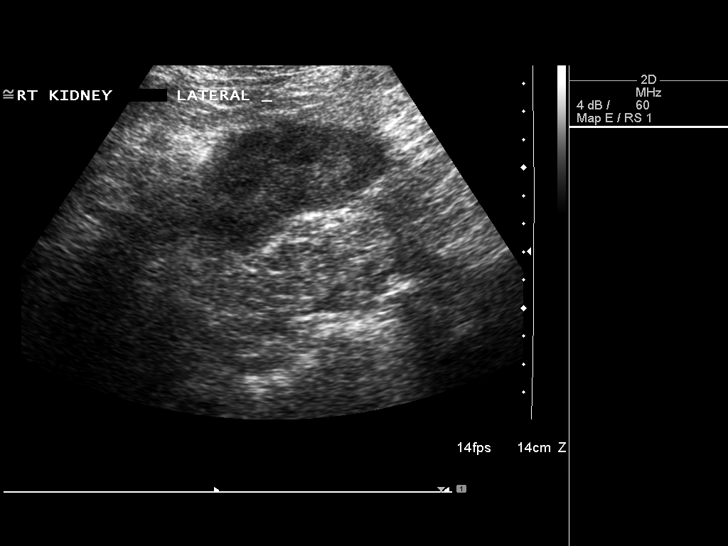
[im 29/77]
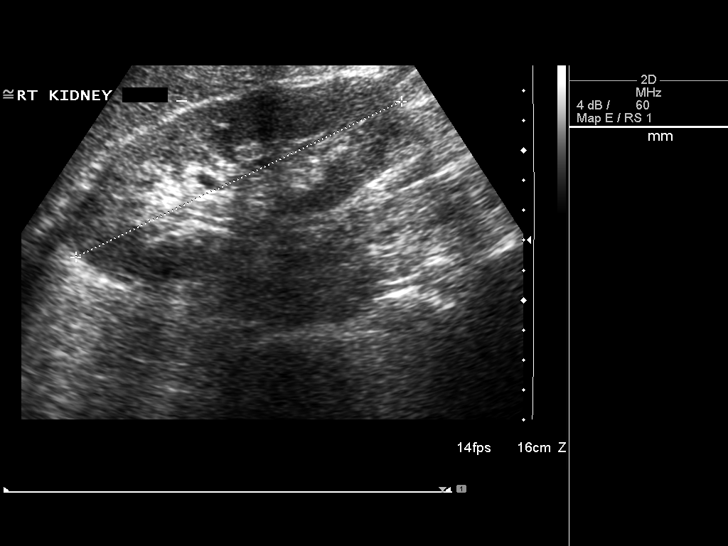
[im 35/77]
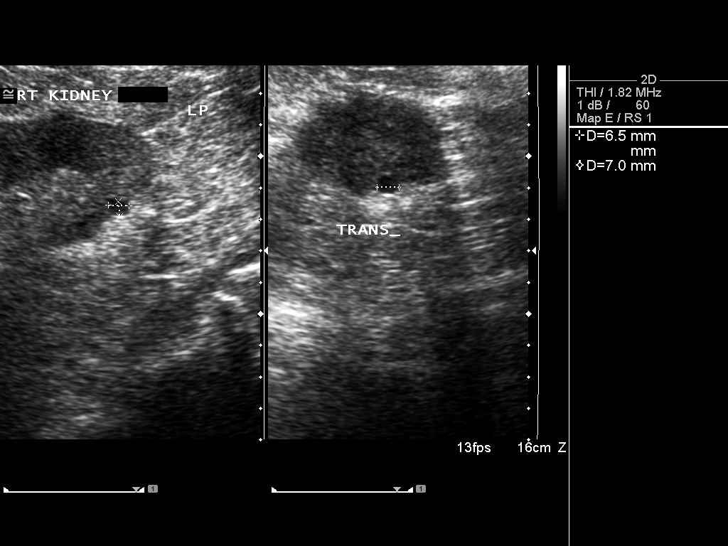
[im 42/77]
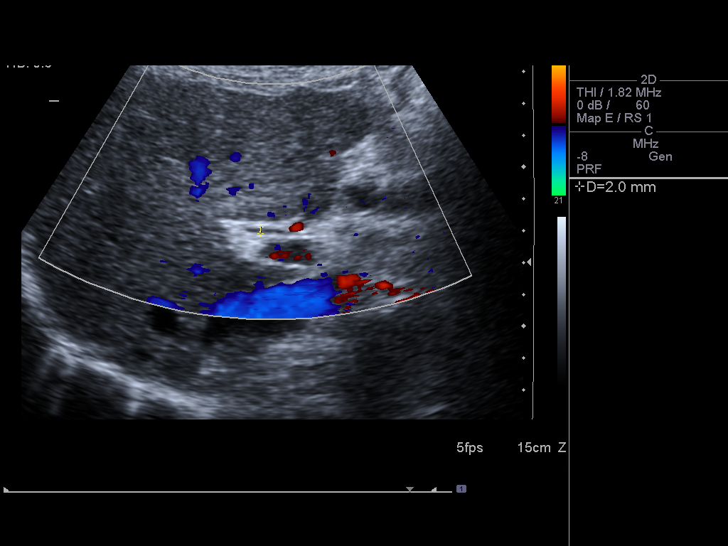
[im 48/77]
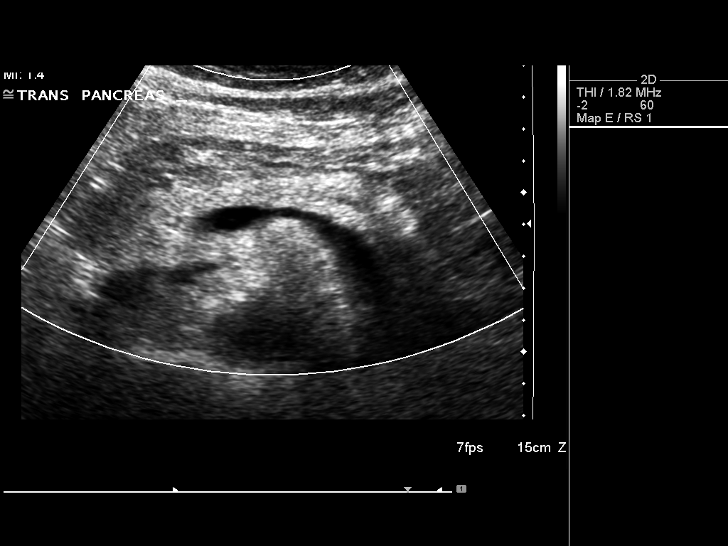
[im 51/77]
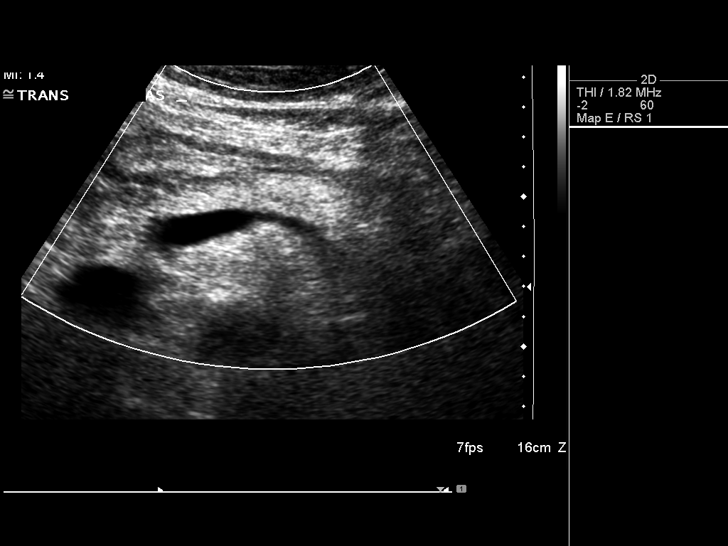
[im 58/77]
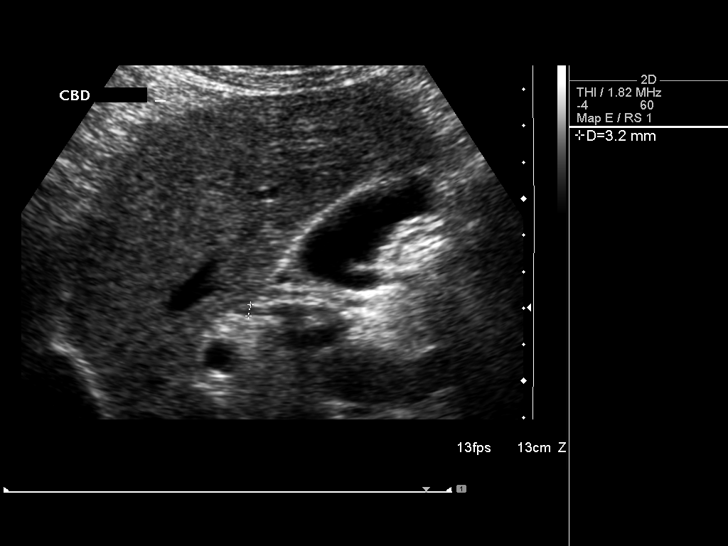
[im 64/77]
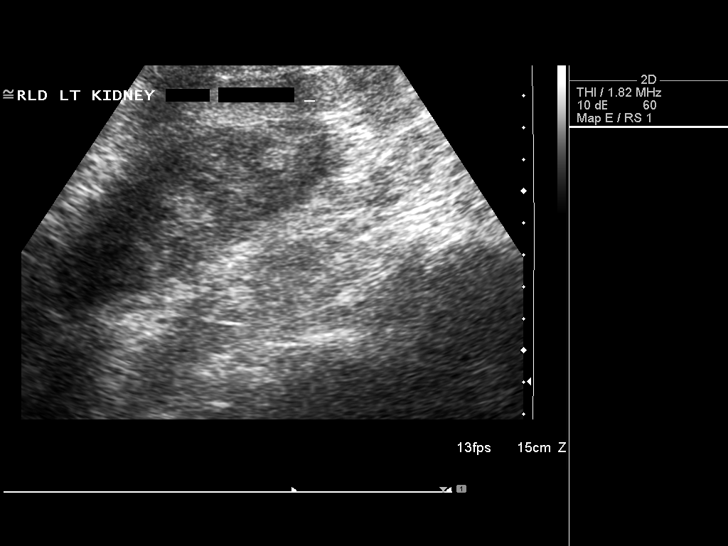
[im 70/77]
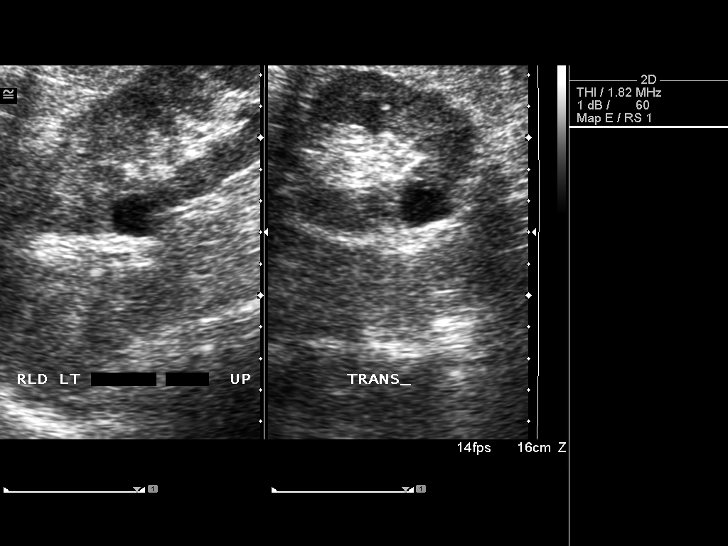
[im 77/77]
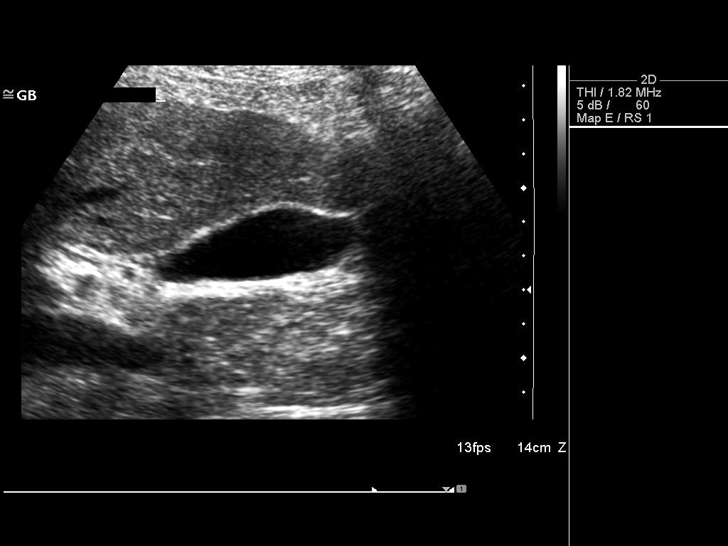

[14 of 25 positions shown; findings below may reference images not displayed]

FINDINGS: Gallbladder:  No gallstones, gallbladder wall thickening, or
pericholecystic fluid.

Common bile duct:  3 mm.

Liver:  No focal lesion identified.  Within normal limits in
parenchymal echogenicity.

IVC:  Appears normal.

Pancreas:  No focal abnormality seen.

Spleen:  4.1 cm.  No focal abnormality.

Right Kidney:  12.2 cm. No hydronephrosis or focal mass. Right
lower renal pole simple appearing cyst measures 0.7 x 0.7 x 0.5 cm

Left Kidney:  12.2 cm.  No hydronephrosis or focal mass. 1.8 x
x 1.4 cm simple appearing upper pole cyst.

Abdominal aorta:  No aneurysm identified.
IMPRESSION: No acute abnormality.

## 2011-12-04 ENCOUNTER — Encounter: Payer: Self-pay | Admitting: *Deleted

## 2012-02-06 ENCOUNTER — Ambulatory Visit (INDEPENDENT_AMBULATORY_CARE_PROVIDER_SITE_OTHER): Payer: Medicare Other | Admitting: *Deleted

## 2012-02-06 ENCOUNTER — Encounter: Payer: Self-pay | Admitting: Internal Medicine

## 2012-02-06 DIAGNOSIS — I442 Atrioventricular block, complete: Secondary | ICD-10-CM

## 2012-02-06 DIAGNOSIS — Z950 Presence of cardiac pacemaker: Secondary | ICD-10-CM

## 2012-02-08 LAB — REMOTE PACEMAKER DEVICE
BATTERY VOLTAGE: 2.62 V
RV LEAD THRESHOLD: 0.625 V
VENTRICULAR PACING PM: 100

## 2012-02-16 ENCOUNTER — Encounter: Payer: Self-pay | Admitting: *Deleted

## 2012-02-22 NOTE — Progress Notes (Signed)
Remote pacer check  

## 2012-05-10 ENCOUNTER — Encounter: Payer: Medicare Other | Admitting: *Deleted

## 2012-05-13 ENCOUNTER — Encounter: Payer: Self-pay | Admitting: *Deleted

## 2012-05-20 ENCOUNTER — Encounter: Payer: Self-pay | Admitting: Internal Medicine

## 2012-05-20 ENCOUNTER — Ambulatory Visit (INDEPENDENT_AMBULATORY_CARE_PROVIDER_SITE_OTHER): Payer: Medicare Other | Admitting: *Deleted

## 2012-05-20 DIAGNOSIS — I442 Atrioventricular block, complete: Secondary | ICD-10-CM

## 2012-05-23 LAB — REMOTE PACEMAKER DEVICE: BMOD-0003RV: 30

## 2012-05-24 ENCOUNTER — Encounter: Payer: Self-pay | Admitting: Internal Medicine

## 2012-05-24 ENCOUNTER — Ambulatory Visit (INDEPENDENT_AMBULATORY_CARE_PROVIDER_SITE_OTHER): Payer: Medicare Other | Admitting: Internal Medicine

## 2012-05-24 ENCOUNTER — Encounter: Payer: Self-pay | Admitting: *Deleted

## 2012-05-24 VITALS — BP 140/84 | HR 65 | Ht 70.0 in | Wt 209.0 lb

## 2012-05-24 DIAGNOSIS — I442 Atrioventricular block, complete: Secondary | ICD-10-CM

## 2012-05-24 DIAGNOSIS — I255 Ischemic cardiomyopathy: Secondary | ICD-10-CM

## 2012-05-24 DIAGNOSIS — I2589 Other forms of chronic ischemic heart disease: Secondary | ICD-10-CM

## 2012-05-24 DIAGNOSIS — Z950 Presence of cardiac pacemaker: Secondary | ICD-10-CM

## 2012-05-24 DIAGNOSIS — I5032 Chronic diastolic (congestive) heart failure: Secondary | ICD-10-CM

## 2012-05-24 LAB — CBC WITH DIFFERENTIAL/PLATELET
Basophils Absolute: 0.1 10*3/uL (ref 0.0–0.1)
Eosinophils Absolute: 0.2 10*3/uL (ref 0.0–0.7)
Eosinophils Relative: 3.2 % (ref 0.0–5.0)
MCHC: 33.5 g/dL (ref 30.0–36.0)
MCV: 97 fl (ref 78.0–100.0)
Monocytes Absolute: 0.7 10*3/uL (ref 0.1–1.0)
Neutrophils Relative %: 55.4 % (ref 43.0–77.0)
Platelets: 202 10*3/uL (ref 150.0–400.0)
WBC: 7.7 10*3/uL (ref 4.5–10.5)

## 2012-05-24 LAB — PROTIME-INR: Prothrombin Time: 11.8 s (ref 10.2–12.4)

## 2012-05-24 LAB — BASIC METABOLIC PANEL
GFR: 81.27 mL/min (ref >60.00)
Potassium: 5.1 mEq/L (ref 3.5–5.1)
Sodium: 140 mEq/L (ref 135–145)

## 2012-05-24 NOTE — Progress Notes (Signed)
  HPI  Richard Charles is a 78 y.o. male  seen in followup for pacemaker implanted for complete heart block in 2005 by Dr. Turrner(and me apparently)  He has a history of coronary artery disease and underwent bypass grafting in [place] 6 years ago. He presented at that time with congestive heart failure shock and a probable MI. A year later after he moved back to [place] he developed bradycardia and underwent pacemaker implantation  . Echo obtained 2012 demonstrated near normal left ventricular function with mild inferior wall motion abnormality; left atrial enlargement he has noticed some sluggishness on the last couple of months. He has been undergoing regular stress went back and forth to [place] to Take care of family members with cancer    Past Medical History  Diagnosis Date  . Hypertension   . Coronary artery disease     status post CABG  . Prostate cancer   . Diabetes mellitus     diet controlled type II  . Emphysema     low-grade  . Complete heart block   . Pacemaker     Medtronic dual-chamber implanted 2007    Past Surgical History  Procedure Date  . Coronary artery bypass graft     x 5 vessels  . Percutaneous pinning tibia fracture     right  . Umbilical hernia repair   . Skin cyst removal   . Cardiac assist device insertion     Medtronic     Current Outpatient Prescriptions  Medication Sig Dispense Refill  . aspirin EC 81 MG tablet Take 1 tablet (81 mg total) by mouth daily.      . atorvastatin (LIPITOR) 20 MG tablet Take 20 mg by mouth daily.        . carvedilol (COREG) 12.5 MG tablet Take 1 tablet (12.5 mg total) by mouth 2 (two) times daily with a meal.  180 tablet  3  . cyclobenzaprine (FLEXERIL) 10 MG tablet Take 10 mg by mouth as needed.        . furosemide (LASIX) 20 MG tablet Take by mouth as needed.      . gabapentin (NEURONTIN) 600 MG tablet Take 300 mg by mouth 2 (two) times daily.       . HYDROcodone-acetaminophen (LORTAB) 7.5-500 MG per  tablet Take by mouth as needed.      . Multiple Vitamin (MULTIVITAMIN) tablet Take 1 tablet by mouth daily.      . polyethylene glycol powder (GLYCOLAX/MIRALAX) powder Take 17 g by mouth daily.       . ramipril (ALTACE) 5 MG capsule Take one capsule by mouth every evening      . sildenafil (VIAGRA) 100 MG tablet Take 100 mg by mouth daily as needed.          No Known Allergies  Review of Systems negative except from HPI and PMH  Physical Exam BP 140/84  Pulse 65  Ht 5' 10" (1.778 m)  Wt 209 lb (94.802 kg)  BMI 29.99 kg/m2 Well developed and well nourished in no acute distress HENT normal E scleral and icterus clear Neck Supple JVP flat; carotids brisk and full Clear to ausculation *Regular rate and rhythm, no murmurs gallops or rub Soft with active bowel sounds No clubbing cyanosis Trace Edema Alert and oriented, grossly normal motor and sensory function Skin Warm and Dry    Assessment and  Plan  

## 2012-05-24 NOTE — Assessment & Plan Note (Signed)
Stable post pacing 

## 2012-05-24 NOTE — Assessment & Plan Note (Signed)
Device has reached ERI. He'll need generator replacement. We have discussed potential risks and benefits including but not limited to lead fracture and infection. He also had questions regarding how we change out the device with him being device dependent. We discussed the possibility of the need for transvenous electrode

## 2012-05-24 NOTE — Assessment & Plan Note (Signed)
Continue current medications - stable

## 2012-05-24 NOTE — Patient Instructions (Signed)
Your physician has recommended that you have a pacemaker generator change.  Please see the instruction sheet given to you today for more information.  Your physician recommends that you continue on your current medications as directed. Please refer to the Current Medication list given to you today.    

## 2012-05-24 NOTE — Assessment & Plan Note (Signed)
Euvolemic continue current medications  

## 2012-05-27 ENCOUNTER — Other Ambulatory Visit: Payer: Self-pay | Admitting: *Deleted

## 2012-05-27 ENCOUNTER — Encounter (HOSPITAL_COMMUNITY): Payer: Self-pay | Admitting: Respiratory Therapy

## 2012-05-27 DIAGNOSIS — I442 Atrioventricular block, complete: Secondary | ICD-10-CM

## 2012-05-28 MED ORDER — CEFAZOLIN SODIUM-DEXTROSE 2-3 GM-% IV SOLR
2.0000 g | INTRAVENOUS | Status: AC
  Filled 2012-05-28 (×2): qty 50

## 2012-05-28 MED ORDER — GENTAMICIN SULFATE 40 MG/ML IJ SOLN
80.0000 mg | INTRAMUSCULAR | Status: AC
  Filled 2012-05-28: qty 2

## 2012-05-29 ENCOUNTER — Encounter (HOSPITAL_COMMUNITY)
Admission: RE | Disposition: A | Discharge status: Home / Self Care | Payer: Self-pay | Source: Ambulatory Visit | Attending: Internal Medicine

## 2012-05-29 ENCOUNTER — Ambulatory Visit (HOSPITAL_COMMUNITY)
Admission: RE | Admit: 2012-05-29 | Discharge: 2012-05-29 | Disposition: A | Discharge status: Home / Self Care | Payer: Medicare Other | Source: Ambulatory Visit | Attending: Internal Medicine | Admitting: Internal Medicine

## 2012-05-29 DIAGNOSIS — J438 Other emphysema: Secondary | ICD-10-CM | POA: Insufficient documentation

## 2012-05-29 DIAGNOSIS — I442 Atrioventricular block, complete: Secondary | ICD-10-CM

## 2012-05-29 DIAGNOSIS — I2510 Atherosclerotic heart disease of native coronary artery without angina pectoris: Secondary | ICD-10-CM | POA: Insufficient documentation

## 2012-05-29 DIAGNOSIS — E119 Type 2 diabetes mellitus without complications: Secondary | ICD-10-CM | POA: Insufficient documentation

## 2012-05-29 DIAGNOSIS — Z8546 Personal history of malignant neoplasm of prostate: Secondary | ICD-10-CM | POA: Insufficient documentation

## 2012-05-29 DIAGNOSIS — Z45018 Encounter for adjustment and management of other part of cardiac pacemaker: Secondary | ICD-10-CM | POA: Insufficient documentation

## 2012-05-29 DIAGNOSIS — I10 Essential (primary) hypertension: Secondary | ICD-10-CM | POA: Insufficient documentation

## 2012-05-29 HISTORY — PX: PERMANENT PACEMAKER GENERATOR CHANGE: SHX6022

## 2012-05-29 LAB — SURGICAL PCR SCREEN: MRSA, PCR: NEGATIVE

## 2012-05-29 SURGERY — PERMANENT PACEMAKER GENERATOR CHANGE
Anesthesia: LOCAL

## 2012-05-29 MED ORDER — SODIUM CHLORIDE 0.45 % IV SOLN
INTRAVENOUS | Status: DC
  Administered 2012-05-29: 11:00:00 via INTRAVENOUS

## 2012-05-29 MED ORDER — SODIUM CHLORIDE 0.9 % IJ SOLN: 3.0000 mL | INTRAMUSCULAR | Status: DC | PRN

## 2012-05-29 MED ORDER — MUPIROCIN 2 % EX OINT
TOPICAL_OINTMENT | CUTANEOUS | Status: AC
  Filled 2012-05-29: qty 22

## 2012-05-29 MED ORDER — FENTANYL CITRATE 0.05 MG/ML IJ SOLN
INTRAMUSCULAR | Status: AC
  Filled 2012-05-29: qty 2

## 2012-05-29 MED ORDER — CHLORHEXIDINE GLUCONATE 4 % EX LIQD: 60.0000 mL | Freq: Once | CUTANEOUS | Status: DC

## 2012-05-29 MED ORDER — HEPARIN (PORCINE) IN NACL 2-0.9 UNIT/ML-% IJ SOLN
INTRAMUSCULAR | Status: AC
  Filled 2012-05-29: qty 1000

## 2012-05-29 MED ORDER — SODIUM CHLORIDE 0.9 % IV SOLN: 250.0000 mL | INTRAVENOUS | Status: DC

## 2012-05-29 MED ORDER — SODIUM CHLORIDE 0.9 % IJ SOLN: 3.0000 mL | Freq: Two times a day (BID) | INTRAMUSCULAR | Status: DC

## 2012-05-29 MED ORDER — MUPIROCIN 2 % EX OINT
TOPICAL_OINTMENT | Freq: Two times a day (BID) | CUTANEOUS | Status: DC
  Administered 2012-05-29: 11:00:00 via NASAL

## 2012-05-29 MED ORDER — MIDAZOLAM HCL 5 MG/5ML IJ SOLN
INTRAMUSCULAR | Status: AC
  Filled 2012-05-29: qty 5

## 2012-05-29 MED ORDER — LIDOCAINE HCL (PF) 1 % IJ SOLN
INTRAMUSCULAR | Status: AC
  Filled 2012-05-29: qty 60

## 2012-05-29 NOTE — H&P (View-Only) (Signed)
  HPI  Richard Charles is a 78 y.o. male  seen in followup for pacemaker implanted for complete heart block in 2005 by Dr. Gevena Barre me apparently)  He has a history of coronary artery disease and underwent bypass grafting in [facility] 6 years ago. He presented at that time with congestive heart failure shock and a probable MI. A year later after he moved back to [facility] he developed bradycardia and underwent pacemaker implantation  . Echo obtained 2012 demonstrated near normal left ventricular function with mild inferior wall motion abnormality; left atrial enlargement he has noticed some sluggishness on the last couple of months. He has been undergoing regular stress went back and forth to [place] to Take care of family members with cancer    Past Medical History  Diagnosis Date  . Hypertension   . Coronary artery disease     status post CABG  . Prostate cancer   . Diabetes mellitus     diet controlled type II  . Emphysema     low-grade  . Complete heart block   . Pacemaker     Medtronic dual-chamber implanted 2007    Past Surgical History  Procedure Date  . Coronary artery bypass graft     x 5 vessels  . Percutaneous pinning tibia fracture     right  . Umbilical hernia repair   . Skin cyst removal   . Cardiac assist device insertion     Medtronic     Current Outpatient Prescriptions  Medication Sig Dispense Refill  . aspirin EC 81 MG tablet Take 1 tablet (81 mg total) by mouth daily.      Marland Kitchen atorvastatin (LIPITOR) 20 MG tablet Take 20 mg by mouth daily.        . carvedilol (COREG) 12.5 MG tablet Take 1 tablet (12.5 mg total) by mouth 2 (two) times daily with a meal.  180 tablet  3  . cyclobenzaprine (FLEXERIL) 10 MG tablet Take 10 mg by mouth as needed.        . furosemide (LASIX) 20 MG tablet Take by mouth as needed.      . gabapentin (NEURONTIN) 600 MG tablet Take 300 mg by mouth 2 (two) times daily.       Marland Kitchen HYDROcodone-acetaminophen (LORTAB) 7.5-500 MG per  tablet Take by mouth as needed.      . Multiple Vitamin (MULTIVITAMIN) tablet Take 1 tablet by mouth daily.      . polyethylene glycol powder (GLYCOLAX/MIRALAX) powder Take 17 g by mouth daily.       . ramipril (ALTACE) 5 MG capsule Take one capsule by mouth every evening      . sildenafil (VIAGRA) 100 MG tablet Take 100 mg by mouth daily as needed.          No Known Allergies  Review of Systems negative except from HPI and PMH  Physical Exam BP 140/84  Pulse 65  Ht 5\' 10"  (1.778 m)  Wt 209 lb (94.802 kg)  BMI 29.99 kg/m2 Well developed and well nourished in no acute distress HENT normal E scleral and icterus clear Neck Supple JVP flat; carotids brisk and full Clear to ausculation *Regular rate and rhythm, no murmurs gallops or rub Soft with active bowel sounds No clubbing cyanosis Trace Edema Alert and oriented, grossly normal motor and sensory function Skin Warm and Dry    Assessment and  Plan

## 2012-05-29 NOTE — Interval H&P Note (Signed)
History and Physical Interval Note:  05/29/2012 11:47 AM  Richard Charles  has presented today for surgery, with the diagnosis of End of life  The various methods of treatment have been discussed with the patient and family. After consideration of risks, benefits and other options for treatment, the patient has consented to  Procedure(s) (LRB): PERMANENT PACEMAKER GENERATOR CHANGE (N/A) as a surgical intervention .  The patient's history has been reviewed, patient examined, no change in status, stable for surgery.  I have reviewed the patient's chart and labs.  Questions were answered to the patient's satisfaction.     Sherryl Manges

## 2012-05-29 NOTE — CV Procedure (Signed)
KENG JEWEL [id number]  [id number]  Preop Dx:chb, previous pacer at [facility]  Postop Dx same/  Procedure:pacemaker generator replacement  Cx: None   \  Following informed consent the patient was brought to the electrophysiology laboratory in place of the fluoroscopic table in the supine position after routine prep and drape lidocaine was infiltrated in the region of the previous incision and carried down to later the device pocket using sharp dissection and electrocautery. The pocket was opened the device was freed up and was explanted.  Interrogation of the previously implanted ventricular lead BSX 4457 demonstrated an R wave of none millivolts., and impedance of 506ohms, and a pacing threshold of 1.0  volts at 0.5 msec.    The previously implanted atrial lead BSX 4480 added P-wave amplitude of 1.2 illlivolts  and impedance of  494 ohms, and a pacing threshold of 1.0volts at 0.5 milliseconds.  The leads were inspected. The leads were then attached to a Medtronic adapta L pulse generator, serial number [id number].    The pocket was irrigated with antibiotic containing saline solution hemostasis was assured and the leads and the device were placed in the pocket. The wound is then closed in 3 layers in normal fashion.  The patient tolerated the procedure without apparent complication.  Sherryl Manges     , MD 05/29/2012 2:00 PM

## 2012-06-03 ENCOUNTER — Encounter: Payer: Self-pay | Admitting: *Deleted

## 2012-06-05 ENCOUNTER — Encounter: Payer: Self-pay | Admitting: Internal Medicine

## 2012-06-05 ENCOUNTER — Ambulatory Visit (INDEPENDENT_AMBULATORY_CARE_PROVIDER_SITE_OTHER): Payer: Medicare Other | Admitting: *Deleted

## 2012-06-05 DIAGNOSIS — I2589 Other forms of chronic ischemic heart disease: Secondary | ICD-10-CM

## 2012-06-05 DIAGNOSIS — I442 Atrioventricular block, complete: Secondary | ICD-10-CM

## 2012-06-05 DIAGNOSIS — I255 Ischemic cardiomyopathy: Secondary | ICD-10-CM

## 2012-06-05 LAB — PACEMAKER DEVICE OBSERVATION
ATRIAL PACING PM: 83
BAMS-0001: 150 {beats}/min
RV LEAD IMPEDENCE PM: 533 Ohm
RV LEAD THRESHOLD: 0.625 V

## 2012-06-05 NOTE — Progress Notes (Signed)
Wound check-PPM 

## 2012-09-09 ENCOUNTER — Encounter: Payer: Medicare Other | Admitting: *Deleted

## 2012-09-10 ENCOUNTER — Encounter: Payer: Self-pay | Admitting: *Deleted

## 2012-09-23 ENCOUNTER — Ambulatory Visit (INDEPENDENT_AMBULATORY_CARE_PROVIDER_SITE_OTHER): Payer: Medicare Other | Admitting: *Deleted

## 2012-09-23 DIAGNOSIS — I442 Atrioventricular block, complete: Secondary | ICD-10-CM

## 2012-09-23 DIAGNOSIS — Z950 Presence of cardiac pacemaker: Secondary | ICD-10-CM

## 2012-09-25 LAB — REMOTE PACEMAKER DEVICE
AL IMPEDENCE PM: 530 Ohm
AL THRESHOLD: 0.625 V
ATRIAL PACING PM: 80
BAMS-0001: 150 {beats}/min
RV LEAD IMPEDENCE PM: 537 Ohm

## 2012-10-08 ENCOUNTER — Encounter: Payer: Self-pay | Admitting: *Deleted

## 2012-10-18 ENCOUNTER — Encounter: Payer: Self-pay | Admitting: Internal Medicine

## 2012-12-06 ENCOUNTER — Other Ambulatory Visit: Payer: Self-pay | Admitting: Emergency Medicine

## 2012-12-06 MED ORDER — CARVEDILOL 12.5 MG PO TABS: 12.5000 mg | ORAL_TABLET | Freq: Two times a day (BID) | ORAL | Status: DC

## 2012-12-09 ENCOUNTER — Other Ambulatory Visit: Payer: Self-pay

## 2012-12-09 MED ORDER — CARVEDILOL 12.5 MG PO TABS: 12.5000 mg | ORAL_TABLET | Freq: Two times a day (BID) | ORAL | Status: AC

## 2012-12-10 ENCOUNTER — Telehealth: Payer: Self-pay | Admitting: Internal Medicine

## 2012-12-10 NOTE — Telephone Encounter (Signed)
New Problem:    Patient called in because his wife has purchased an Product manager (whith uses ionization) and he wanted to know if it would be ok for him to be around it.  Please call back.

## 2012-12-10 NOTE — Telephone Encounter (Signed)
Will forward to Dr. Graciela Husbands to see if any contraindication.

## 2012-12-10 NOTE — Telephone Encounter (Signed)
I reviewed with Dr. Graciela Husbands. He is not certain what ionization is, but does not think there should be a contraindication. I have notified the patient.

## 2012-12-30 ENCOUNTER — Other Ambulatory Visit: Payer: Self-pay

## 2012-12-30 ENCOUNTER — Ambulatory Visit (INDEPENDENT_AMBULATORY_CARE_PROVIDER_SITE_OTHER): Payer: Medicare Other | Admitting: *Deleted

## 2012-12-30 DIAGNOSIS — Z950 Presence of cardiac pacemaker: Secondary | ICD-10-CM

## 2012-12-30 DIAGNOSIS — I442 Atrioventricular block, complete: Secondary | ICD-10-CM

## 2013-01-07 LAB — REMOTE PACEMAKER DEVICE
ATRIAL PACING PM: 80
BAMS-0001: 150 {beats}/min
RV LEAD IMPEDENCE PM: 519 Ohm
VENTRICULAR PACING PM: 100

## 2013-01-17 ENCOUNTER — Encounter: Payer: Self-pay | Admitting: *Deleted

## 2013-01-20 ENCOUNTER — Encounter: Payer: Self-pay | Admitting: Internal Medicine

## 2013-04-01 ENCOUNTER — Encounter: Payer: Self-pay | Admitting: Internal Medicine

## 2013-04-01 ENCOUNTER — Ambulatory Visit (INDEPENDENT_AMBULATORY_CARE_PROVIDER_SITE_OTHER): Payer: Medicare Other | Admitting: *Deleted

## 2013-04-01 DIAGNOSIS — I442 Atrioventricular block, complete: Secondary | ICD-10-CM

## 2013-04-01 DIAGNOSIS — Z950 Presence of cardiac pacemaker: Secondary | ICD-10-CM

## 2013-04-07 LAB — REMOTE PACEMAKER DEVICE
AL THRESHOLD: 0.625 V
BAMS-0001: 150 {beats}/min
RV LEAD IMPEDENCE PM: 546 Ohm

## 2013-05-01 ENCOUNTER — Encounter: Payer: Self-pay | Admitting: *Deleted

## 2013-05-26 ENCOUNTER — Encounter: Payer: Self-pay | Admitting: *Deleted

## 2013-05-27 ENCOUNTER — Encounter: Payer: Medicare Other | Admitting: Internal Medicine

## 2013-05-27 NOTE — Progress Notes (Deleted)
Patient Care Team: Georgann Housekeeper, MD as PCP - General (Internal Medicine)   HPI  Richard Charles is a 79 y.o. male seen in followup for pacemaker implanted for complete heart block in 2005 by Dr. Gevena Barre me apparently)   He has a history of coronary artery disease and underwent bypass grafting in [facility] He presented at that time with congestive heart failure shock and a probable MI. A year later after he moved back to [place] he developed bradycardia and underwent pacemaker implantation   . Echo obtained 2012 demonstrated near normal left ventricular function with mild inferior wall motion abnormality; left atrial enlargement he has noticed some sluggishness on the last couple of months.    He is s/p pacer generator replacement  Past Medical History  Diagnosis Date  . Hypertension   . Coronary artery disease     status post CABG  . Prostate cancer   . Diabetes mellitus     diet controlled type II  . Emphysema     low-grade  . Complete heart block   . Pacemaker     Medtronic dual-chamber implanted 2007    Past Surgical History  Procedure Laterality Date  . Coronary artery bypass graft      x 5 vessels  . Percutaneous pinning tibia fracture      right  . Umbilical hernia repair    . Skin cyst removal    . Cardiac assist device insertion      Medtronic     Current Outpatient Prescriptions  Medication Sig Dispense Refill  . aspirin EC 81 MG tablet Take 1 tablet (81 mg total) by mouth daily.      Marland Kitchen atorvastatin (LIPITOR) 20 MG tablet Take 20 mg by mouth daily.        . carvedilol (COREG) 12.5 MG tablet Take 1 tablet (12.5 mg total) by mouth 2 (two) times daily with a meal.  180 tablet  3  . furosemide (LASIX) 20 MG tablet Take 20 mg by mouth as needed. For swelling      . gabapentin (NEURONTIN) 300 MG capsule Take 300 mg by mouth 2 (two) times daily.      . Multiple Vitamin (MULTIVITAMIN) tablet Take 1 tablet by mouth daily.      . polyethylene  glycol powder (GLYCOLAX/MIRALAX) powder Take 17 g by mouth daily.       . ramipril (ALTACE) 5 MG capsule Take 5 mg by mouth daily.       . sildenafil (VIAGRA) 100 MG tablet Take 100 mg by mouth daily as needed.        No current facility-administered medications for this visit.    No Known Allergies  Review of Systems negative except from HPI and PMH  Physical Exam There were no vitals taken for this visit. Well developed and well nourished in no acute distress HENT normal E scleral and icterus clear Neck Supple JVP flat; carotids brisk and full Clear to ausculation {CARD RHYTHM:10874} ***Regular rate and rhythm, no murmurs gallops or rub Soft with active bowel sounds No clubbing cyanosis {Numbers; edema:17696} Edema Alert and oriented, grossly normal motor and sensory function Skin Warm and Dry    Assessment and  Plan

## 2013-06-03 ENCOUNTER — Telehealth: Payer: Self-pay | Admitting: Internal Medicine

## 2013-06-03 NOTE — Telephone Encounter (Signed)
Pt signed ROI rec Via Fax, LOV,Cath,Device Check,Echo,12 Lead faxed to  [facility] 06/03/13/KM

## 2013-06-04 ENCOUNTER — Encounter: Payer: Self-pay | Admitting: Internal Medicine

## 2013-06-27 ENCOUNTER — Telehealth: Payer: Self-pay | Admitting: *Deleted

## 2013-06-27 NOTE — Telephone Encounter (Signed)
Pt called to inform device clinic he moved to [place], [place] Richard Charles

## 2014-10-15 ENCOUNTER — Encounter (HOSPITAL_COMMUNITY): Payer: Self-pay | Admitting: Internal Medicine

## 2014-12-07 DEATH — deceased

## 2016-08-03 NOTE — Progress Notes (Signed)
This encounter was created in error - please disregard.
